# Patient Record
Sex: Male | Born: 1950 | Race: White | Hispanic: No | Marital: Married | Smoking: Former smoker
Health system: Southern US, Community
[De-identification: ages and names within clinical notes are randomized; demographics above are authoritative.]

## PROBLEM LIST (undated history)

## (undated) DIAGNOSIS — C801 Malignant (primary) neoplasm, unspecified: Secondary | ICD-10-CM

## (undated) HISTORY — PX: EYE SURGERY: SHX253

## (undated) HISTORY — DX: Malignant (primary) neoplasm, unspecified: C80.1

## (undated) HISTORY — PX: HERNIA REPAIR: SHX51

## (undated) HISTORY — PX: TONSILLECTOMY: SUR1361

---

## 2000-10-26 ENCOUNTER — Other Ambulatory Visit: Admission: RE | Admit: 2000-10-26 | Discharge: 2000-10-26 | Payer: Self-pay | Admitting: Gastroenterology

## 2000-10-26 ENCOUNTER — Encounter (INDEPENDENT_AMBULATORY_CARE_PROVIDER_SITE_OTHER): Payer: Self-pay | Admitting: Specialist

## 2002-01-17 ENCOUNTER — Encounter: Payer: Self-pay | Admitting: Internal Medicine

## 2002-01-17 ENCOUNTER — Inpatient Hospital Stay (HOSPITAL_COMMUNITY): Admission: EM | Admit: 2002-01-17 | Discharge: 2002-01-19 | Payer: Self-pay | Admitting: Emergency Medicine

## 2003-10-01 ENCOUNTER — Ambulatory Visit (HOSPITAL_COMMUNITY): Admission: RE | Admit: 2003-10-01 | Discharge: 2003-10-01 | Payer: Self-pay

## 2003-10-01 ENCOUNTER — Ambulatory Visit (HOSPITAL_BASED_OUTPATIENT_CLINIC_OR_DEPARTMENT_OTHER): Admission: RE | Admit: 2003-10-01 | Discharge: 2003-10-01 | Payer: Self-pay

## 2004-05-20 ENCOUNTER — Ambulatory Visit (HOSPITAL_COMMUNITY): Admission: RE | Admit: 2004-05-20 | Discharge: 2004-05-20 | Payer: Self-pay | Admitting: Family Medicine

## 2006-08-30 DIAGNOSIS — C801 Malignant (primary) neoplasm, unspecified: Secondary | ICD-10-CM

## 2006-08-30 HISTORY — DX: Malignant (primary) neoplasm, unspecified: C80.1

## 2007-06-21 ENCOUNTER — Other Ambulatory Visit: Admission: RE | Admit: 2007-06-21 | Discharge: 2007-06-21 | Payer: Self-pay | Admitting: Otolaryngology

## 2007-07-18 ENCOUNTER — Ambulatory Visit (HOSPITAL_COMMUNITY): Admission: RE | Admit: 2007-07-18 | Discharge: 2007-07-18 | Payer: Self-pay | Admitting: Otolaryngology

## 2007-07-18 IMAGING — PT NM PET TUM IMG SKULL BASE T - THIGH
6 series · 25 of 25 positions shown · non-contrast
Comparison: none

CLINICAL DATA: 56 year old male with enlargement of the left tonsil and an enlarged lymph node.  FNA equivocal.  
FDG PET-CT TUMOR IMAGING (SKULL BASE TO THIGHS) ? [DATE]: 
Fasting Blood Glucose:  156.
TECHNIQUE: 17.6 mCi F18-FDG were administered via the left forearm.  Full ring PET imaging was performed from the skull base through the mid-thighs 70 minutes after injection.  CT data was obtained and used for attenuation correction and anatomic localization only.  (This was not acquired as a diagnostic CT examination.)  Additional neck images were performed with arms in down position.

[Series 1: pet ac · axial · 3.3mm · 4.69mm/px · z∈[-294,+0]mm · 5 of 91 slices shown]
[im 1/91]
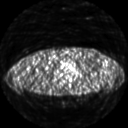
[im 23/91]
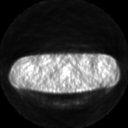
[im 46/91]
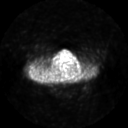
[im 68/91]
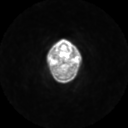
[im 91/91]
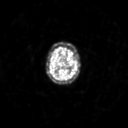

[Series 2: ct neck · axial · 3.8mm · 0.98mm/px · z∈[-294,+0]mm · 5 of 91 slices shown]
[im 1/91]
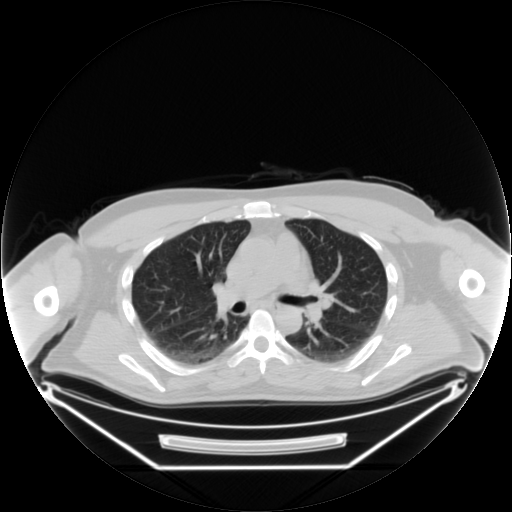
[im 23/91]
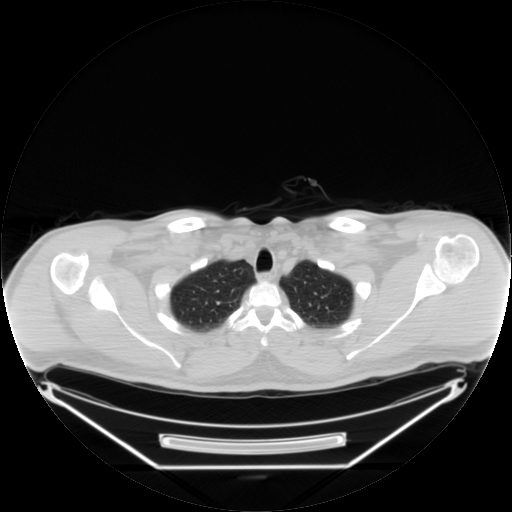
[im 46/91]
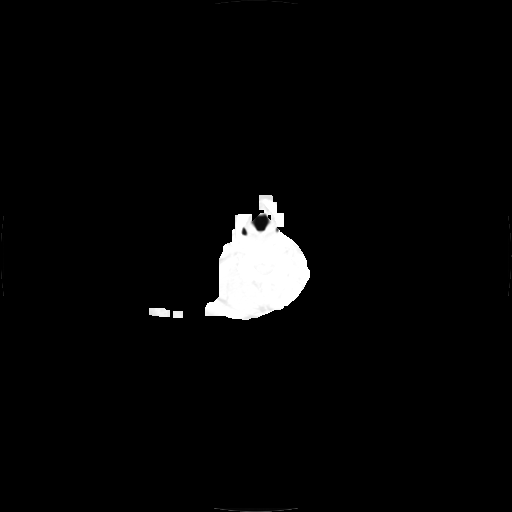
[im 68/91]
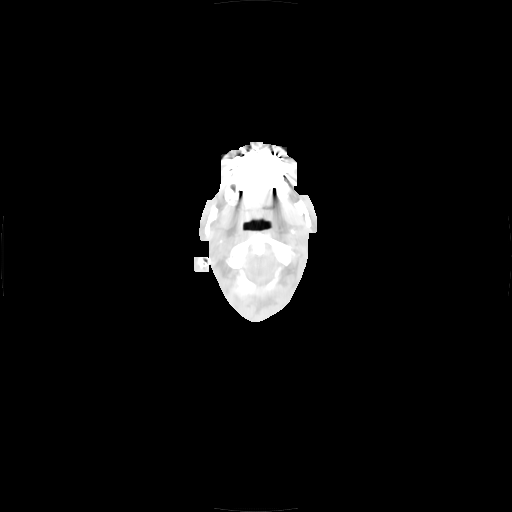
[im 91/91  brain]
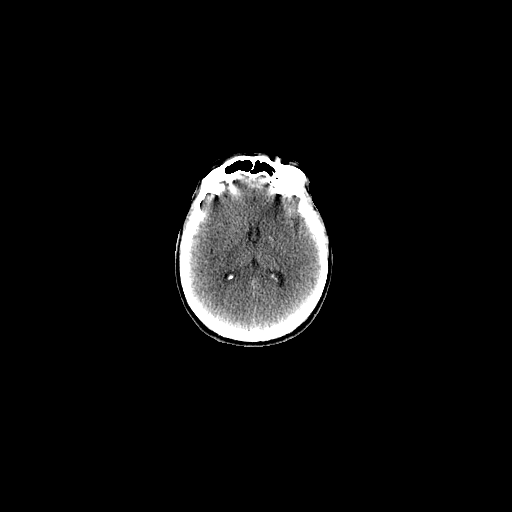

[Series 2: pet nac · axial · 3.3mm · 4.69mm/px · z∈[-294,+0]mm · 5 of 91 slices shown]
[im 1/91]
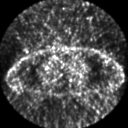
[im 23/91]
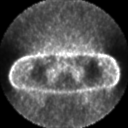
[im 46/91]
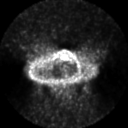
[im 68/91]
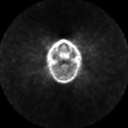
[im 91/91]
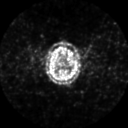

[Series 123: mip · coronal · 3.3mm · 4.69mm/px · 2 of 30 slices shown]
[im 1/30]
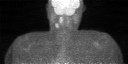
[im 30/30]
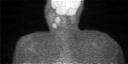

[Series 151: reformatted · axial · 3.3mm · 3.91mm/px · z∈[-294,-3]mm · 5 of 89 slices shown (1 of 2)]
[im 1/89]
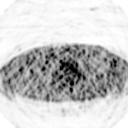
[im 23/89]
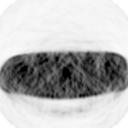
[im 45/89]
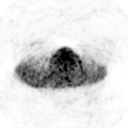
[im 67/89]
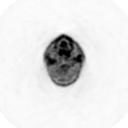
[im 89/89]
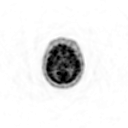

[Series 153: reformatted · coronal · 4.7mm · 4.69mm/px · 3 of 53 slices shown (2 of 2)]
[im 1/53]
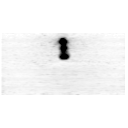
[im 27/53]
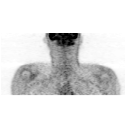
[im 53/53]
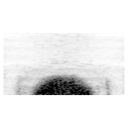

[25 of 25 positions shown; findings below may reference images not displayed]

FINDINGS: Neck:  There is asymmetric hypermetabolic activity in the left hypopharynx which is associated with asymmetric left palatine tonsillar enlargement and SUV-max equal to 7.8 (image #28).  There is an adjacent 16 mm left Level 2-A lymph node (image #36) with an SUV-max equal to 6.1.  There are multiple scattered subcentimeter Level 2-A and 2-B lymph nodes bilaterally which do not have appreciable FDG uptake.  
Thorax:  No hypermetabolic mediastinal or hilar lymph nodes.  No suspicious pulmonary nodules.
Abdomen and Pelvis:  No hypermetabolic abnormal activity within the solid organs.  No hypermetabolic lymph nodes within the abdomen or pelvis.
IMPRESSION: 1.  Intensely hypermetabolic and asymmetric enlargement within the left tonsillar region is concerning for malignancy and much less likely post-biopsy inflammation.  
2.  Intensely hypermetabolic left Level 2-A lymph node (16 mm) concerning for nodal metastasis.  
3.  No evidence of distant metastasis in the chest, abdomen, or pelvis.

## 2007-07-18 IMAGING — PT NM PET TUM IMG SKULL BASE T - THIGH
6 series · 25 of 25 positions shown · non-contrast
Comparison: none

CLINICAL DATA: 56 year old male with enlargement of the left tonsil and an enlarged lymph node.  FNA equivocal.  
FDG PET-CT TUMOR IMAGING (SKULL BASE TO THIGHS) ? [DATE]: 
Fasting Blood Glucose:  156.
TECHNIQUE: 17.6 mCi F18-FDG were administered via the left forearm.  Full ring PET imaging was performed from the skull base through the mid-thighs 70 minutes after injection.  CT data was obtained and used for attenuation correction and anatomic localization only.  (This was not acquired as a diagnostic CT examination.)  Additional neck images were performed with arms in down position.

[Series 1: pet ac · axial · 3.3mm · 4.69mm/px · z∈[-870,+0]mm · 5 of 267 slices shown]
[im 1/267]
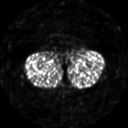
[im 67/267]
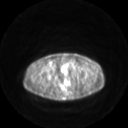
[im 134/267]
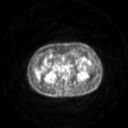
[im 200/267]
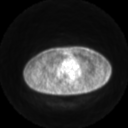
[im 267/267]
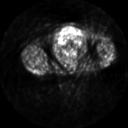

[Series 2: pet nac · axial · 3.3mm · 4.69mm/px · z∈[-870,+0]mm · 6 of 267 slices shown]
[im 1/267]
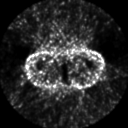
[im 54/267]
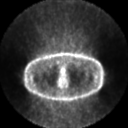
[im 107/267]
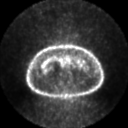
[im 160/267]
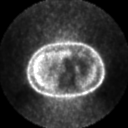
[im 213/267]
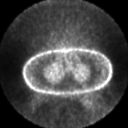
[im 267/267]
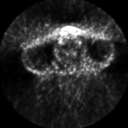

[Series 2: ct images · axial · 3.8mm · 0.98mm/px · z∈[-870,+0]mm · 6 of 267 slices shown]
[im 1/267]
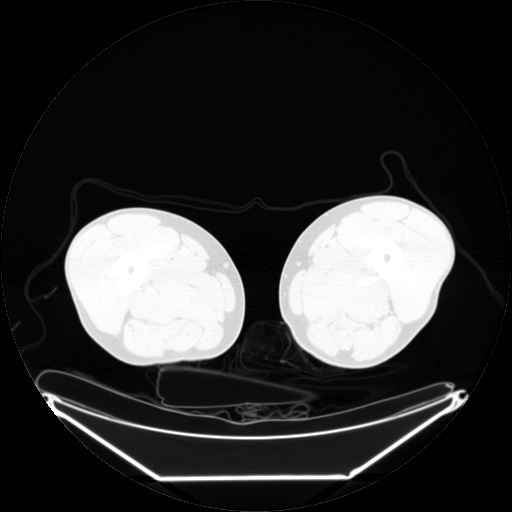
[im 54/267]
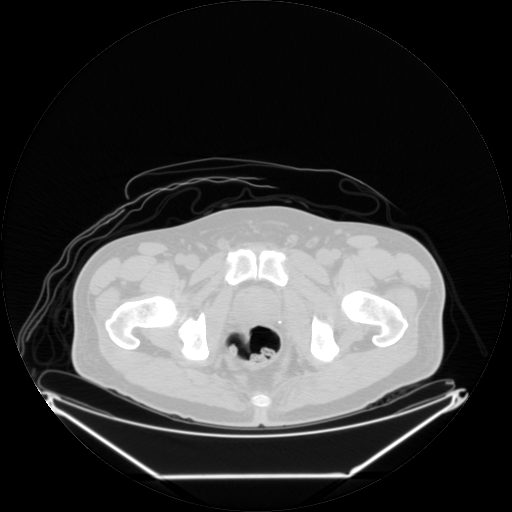
[im 107/267]
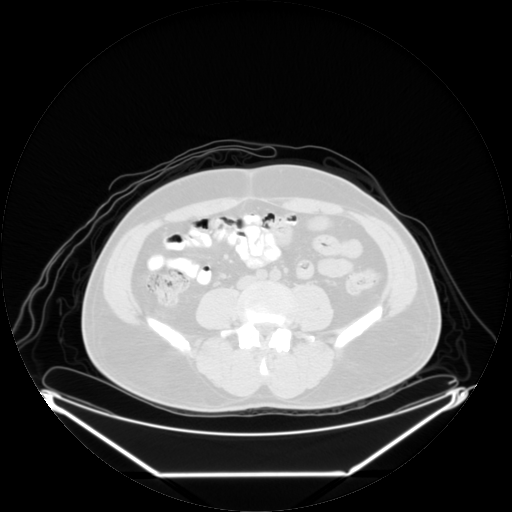
[im 160/267]
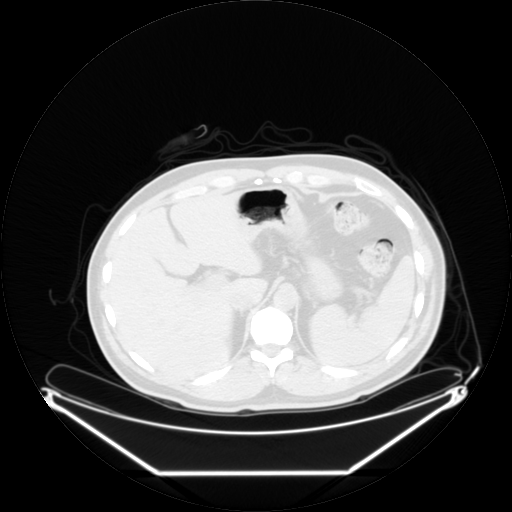
[im 213/267]
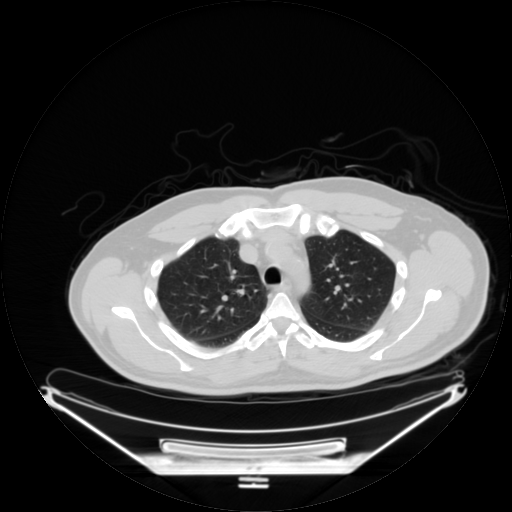
[im 267/267]
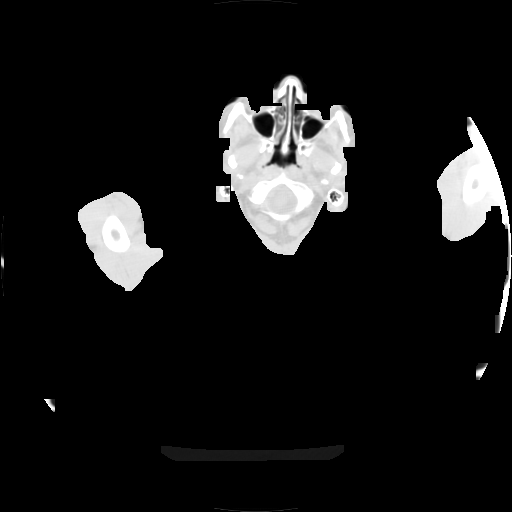

[Series 123: mip · coronal · 3.3mm · 4.69mm/px · 1 of 30 slices shown]
[im 1/30]
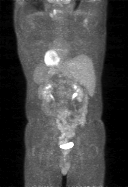

[Series 151: reformatted · axial · 3.3mm · 3.91mm/px · z∈[-863,-7]mm · 6 of 263 slices shown (1 of 2)]
[im 1/263]
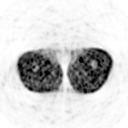
[im 53/263]
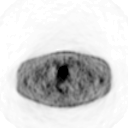
[im 105/263]
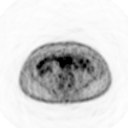
[im 158/263]
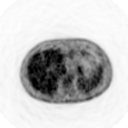
[im 210/263]
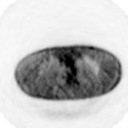
[im 263/263]
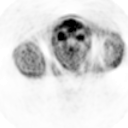

[Series 153: reformatted · coronal · 4.7mm · 6.98mm/px · 1 of 61 slices shown (2 of 2)]
[im 1/61]
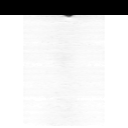

[25 of 25 positions shown; findings below may reference images not displayed]

FINDINGS: Neck:  There is asymmetric hypermetabolic activity in the left hypopharynx which is associated with asymmetric left palatine tonsillar enlargement and SUV-max equal to 7.8 (image #28).  There is an adjacent 16 mm left Level 2-A lymph node (image #36) with an SUV-max equal to 6.1.  There are multiple scattered subcentimeter Level 2-A and 2-B lymph nodes bilaterally which do not have appreciable FDG uptake.  
Thorax:  No hypermetabolic mediastinal or hilar lymph nodes.  No suspicious pulmonary nodules.
Abdomen and Pelvis:  No hypermetabolic abnormal activity within the solid organs.  No hypermetabolic lymph nodes within the abdomen or pelvis.
IMPRESSION: 1.  Intensely hypermetabolic and asymmetric enlargement within the left tonsillar region is concerning for malignancy and much less likely post-biopsy inflammation.  
2.  Intensely hypermetabolic left Level 2-A lymph node (16 mm) concerning for nodal metastasis.  
3.  No evidence of distant metastasis in the chest, abdomen, or pelvis.

## 2008-09-25 ENCOUNTER — Ambulatory Visit (HOSPITAL_COMMUNITY): Admission: RE | Admit: 2008-09-25 | Discharge: 2008-09-25 | Payer: Self-pay | Admitting: Family Medicine

## 2008-09-25 IMAGING — CR DG CHEST 2V
2 series · 2 of 2 positions shown · non-contrast
Comparison: [DATE]

CLINICAL DATA: Cough.

CHEST - 2 VIEW

[view not recorded (1 of 2)]
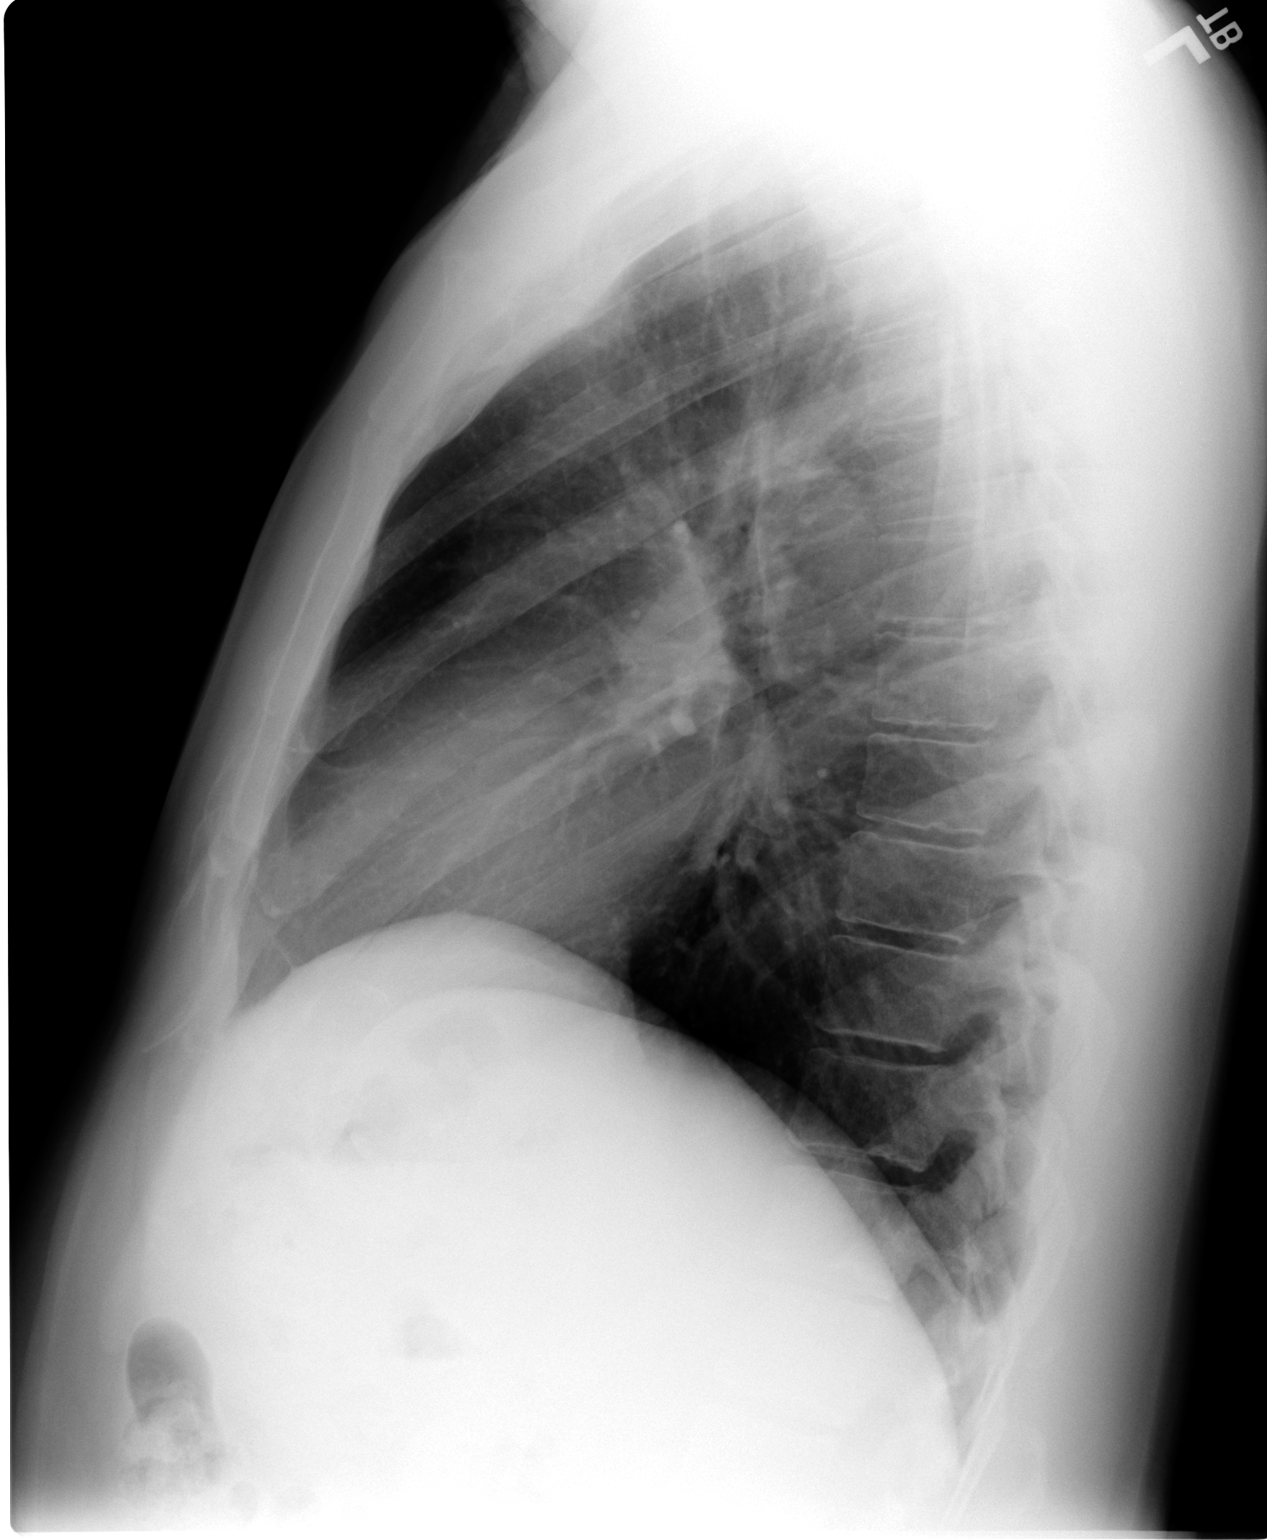

[view not recorded (2 of 2)]
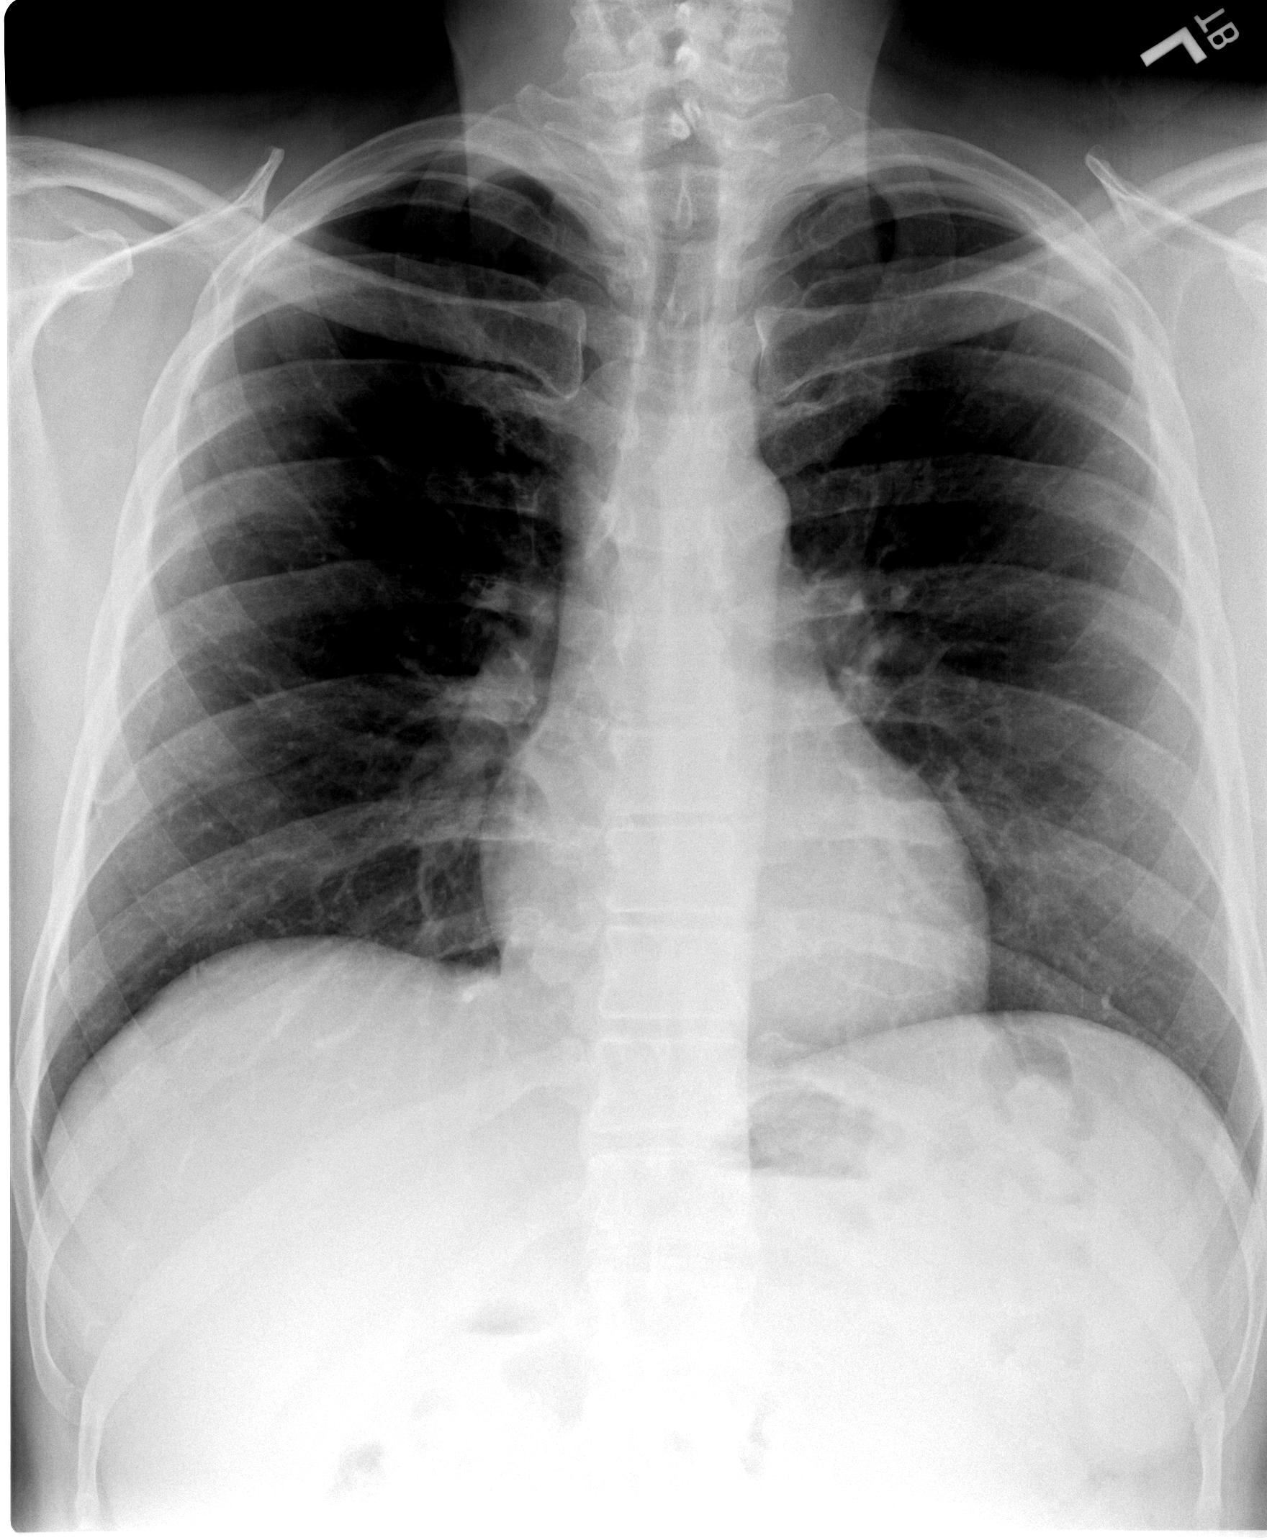

[2 of 2 positions shown; findings below may reference images not displayed]

FINDINGS: The heart size and mediastinal contours are within normal
limits.  Both lungs are clear.  The visualized skeletal structures
are unremarkable.
IMPRESSION: No active cardiopulmonary disease.

## 2008-10-11 ENCOUNTER — Ambulatory Visit (HOSPITAL_COMMUNITY): Admission: RE | Admit: 2008-10-11 | Discharge: 2008-10-11 | Payer: Self-pay | Admitting: Family Medicine

## 2008-10-11 IMAGING — US US ABDOMEN COMPLETE
1 series · 14 of 25 positions shown · non-contrast
Comparison: None

CLINICAL DATA: Abdominal pain, nausea, diabetes

ABDOMEN ULTRASOUND
TECHNIQUE: Complete abdominal ultrasound examination was performed
including evaluation of the liver, gallbladder, bile ducts,
pancreas, kidneys, spleen, IVC, and abdominal aorta.

[Series 1: unknown · 0.40mm/px · 14 of 57 slices shown]
[im 1/57]
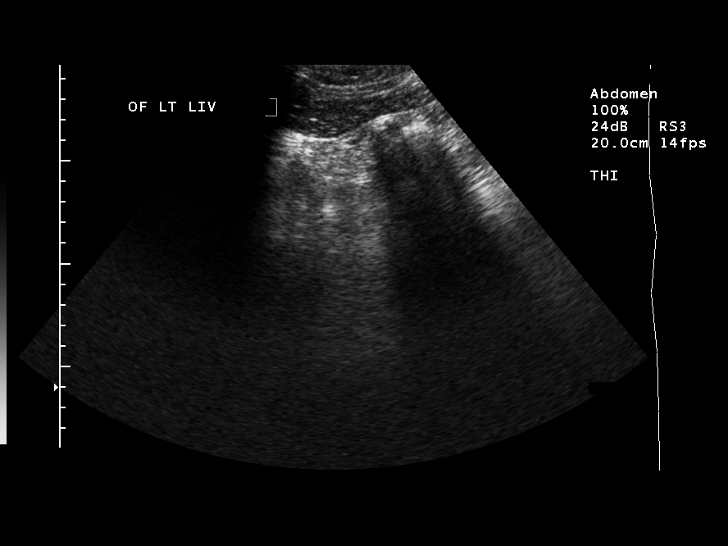
[im 5/57]
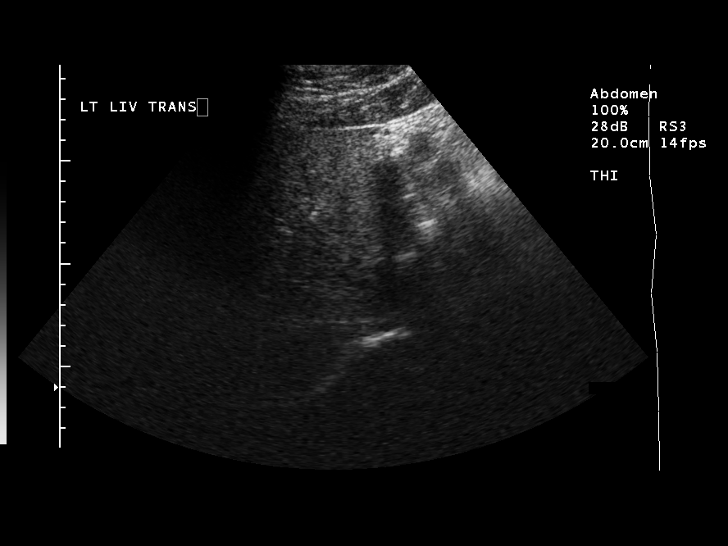
[im 10/57]
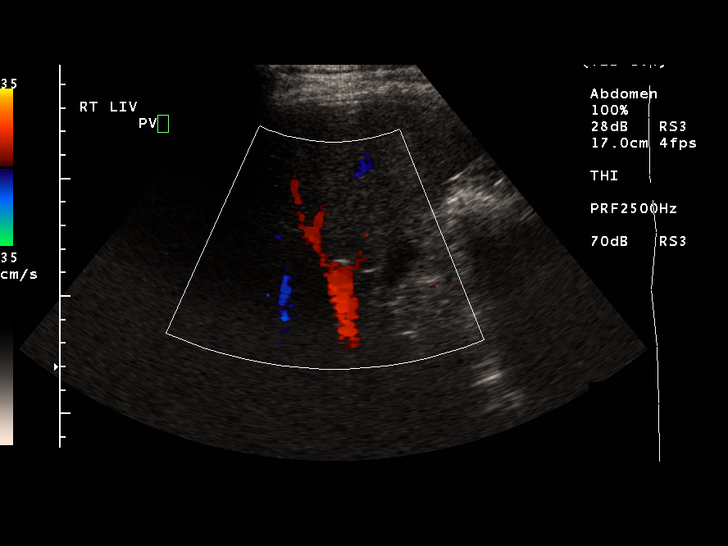
[im 15/57]
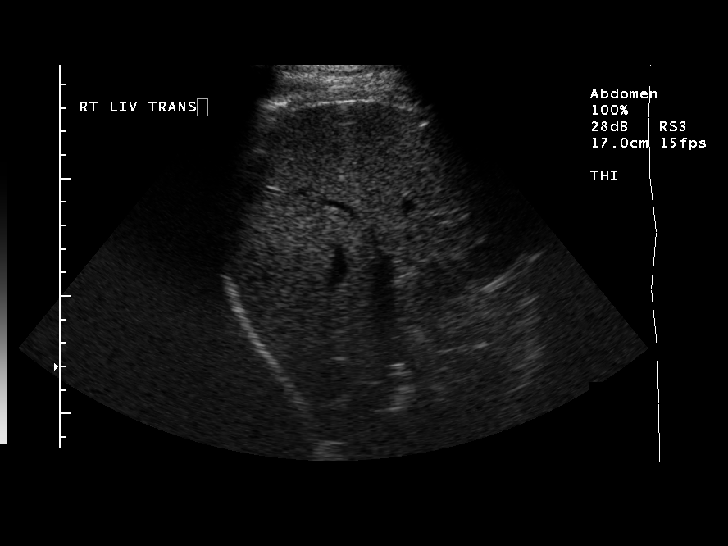
[im 19/57]
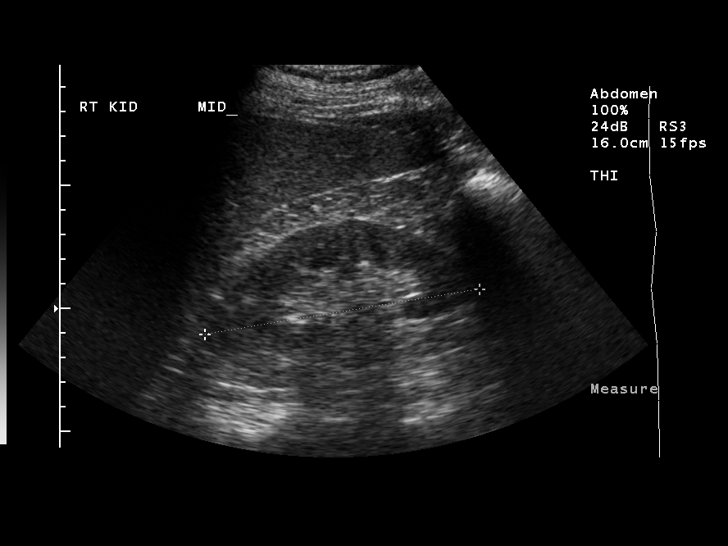
[im 22/57]
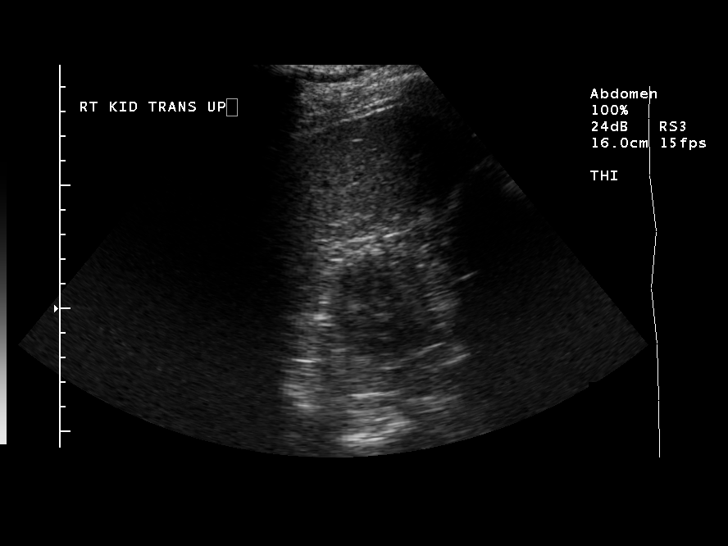
[im 26/57]
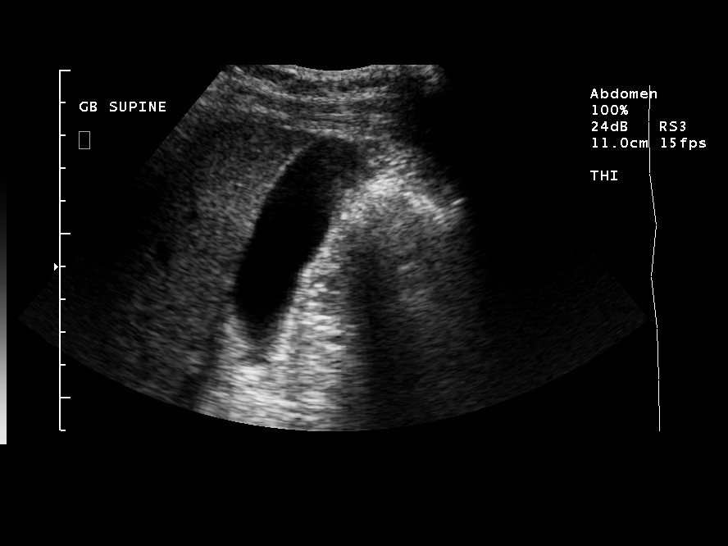
[im 31/57]
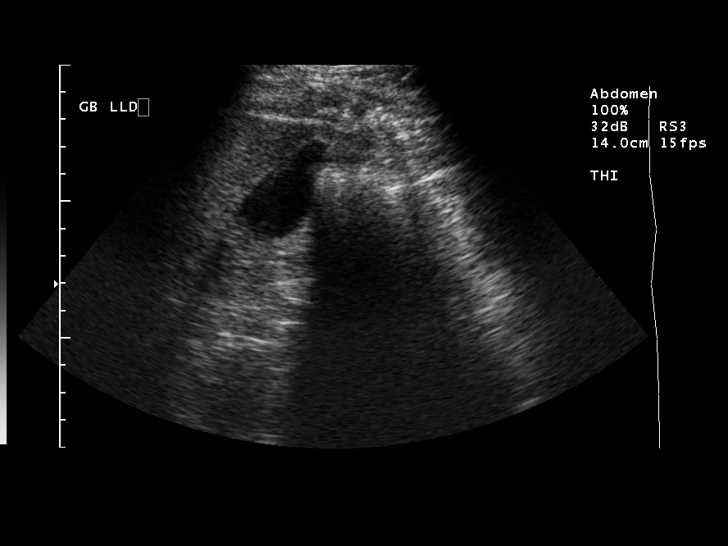
[im 36/57]
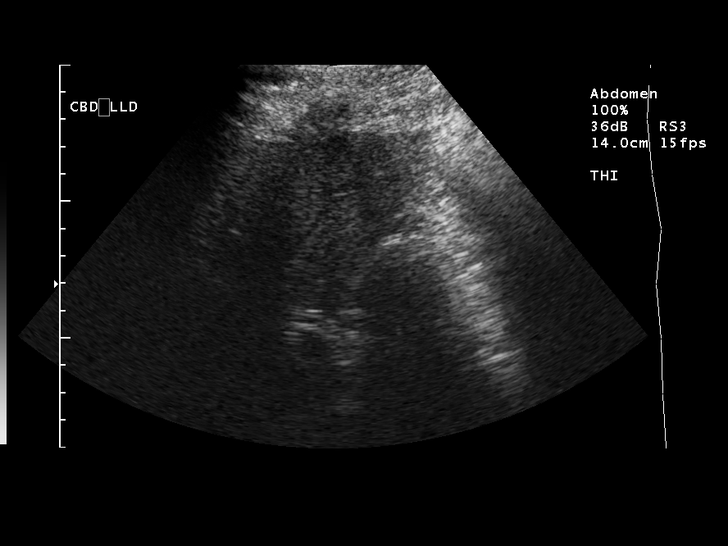
[im 38/57]
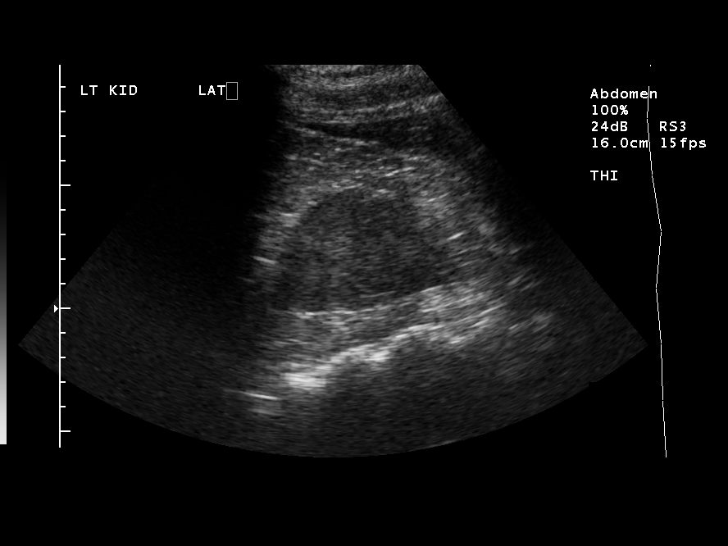
[im 43/57]
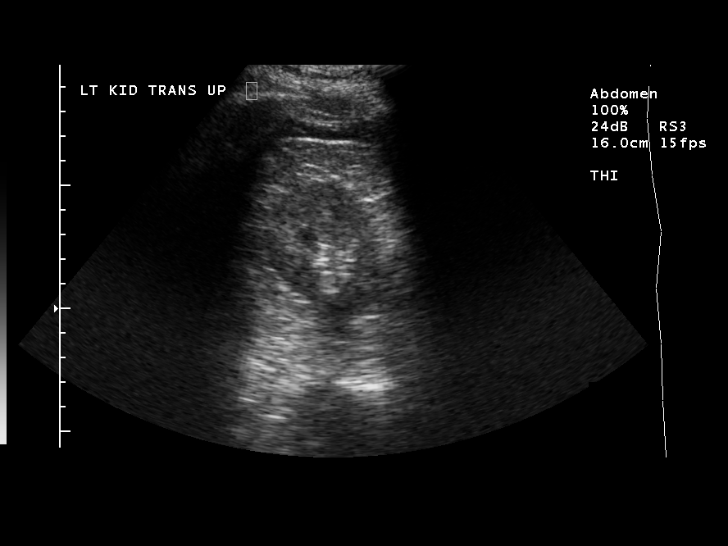
[im 47/57]
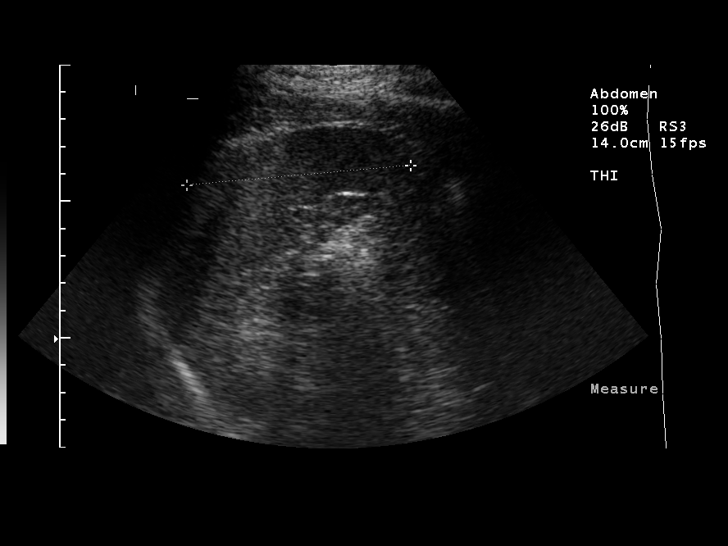
[im 52/57]
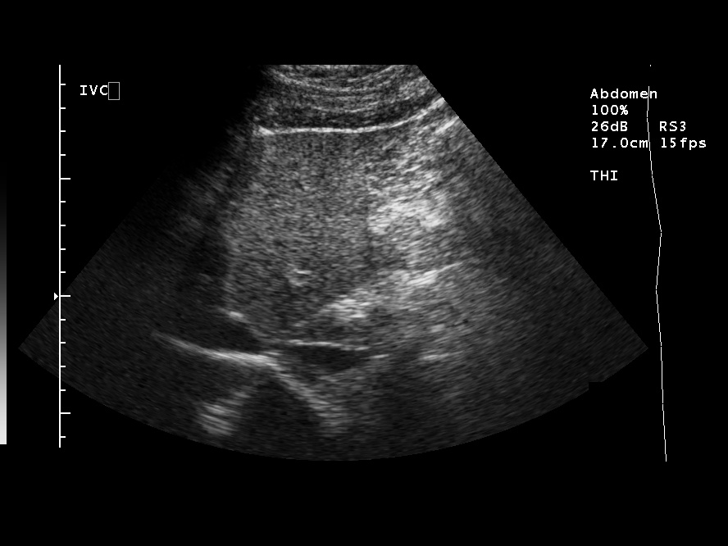
[im 57/57]
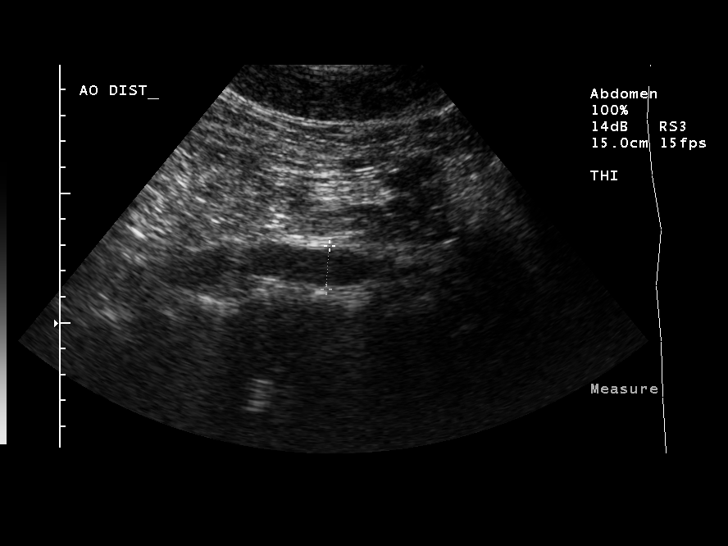

[14 of 25 positions shown; findings below may reference images not displayed]

FINDINGS: Gallbladder normally distended without stones or wall thickening.
Common bile duct 4 mm diameter proximally, normal.
Distal CBD, IVC, pancreas, and proximal aorta are poorly visualized
due to bowel gas.
No definite focal hepatic or splenic abnormalities, spleen 8.2 cm
length.
Kidneys normal size and morphology, 11.3 cm length right and
cm left.
No ascites.
IMPRESSION: Inadequate visualization of the IVC, pancreas, proximal aorta, and
distal CBD due to bowel gas.
Otherwise negative exam.

REF:A1 DICTATED: [DATE] [DATE]

## 2011-01-15 NOTE — Discharge Summary (Signed)
[facility]  Patient:    Samuel Barr, Samuel Barr Visit Number: [id number] MRN: [id number]          Service Type: MED Location: 2A [phone] Attending Physician:  Cassell Smiles. Dictated by:   Elfredia Nevins, M.D. Admit Date:  01/17/2002 Discharge Date: 01/19/2002                             Discharge Summary  DISCHARGE DIAGNOSES: 1. Non-insulin-dependent diabetes mellitus. 2. Hyperlipidemia. 3. Chest pain of uncertain etiology, asymptomatic. 4. Bradycardia.  SUMMARY:  The patient was admitted with unexplained chest pain and flat cardiac enzymes.  A cardiology consultation was obtained while in house.  He was discharged in stable condition by [facility] and transferred to [facility] for continued cardiac evaluation and catheterization. Dictated by:   Elfredia Nevins, M.D. Attending Physician:  Cassell Smiles DD:  02/15/02 TD:  02/16/02 Job: [id number]

## 2011-01-15 NOTE — H&P (Signed)
[facility]  Patient:    Samuel Barr, Samuel Barr Visit Number: [id number] MRN: [id number]          Service Type: MED Location: 2A [id number] 01 Attending Physician:  Hilario Quarry Dictated by:   Elfredia Nevins, M.D. Admit Date:  01/17/2002                           History and Physical  DATE OF BIRTH:  03/09/1951  CHIEF COMPLAINT:  Chest pain.  HISTORY OF PRESENT ILLNESS:  The patient was minimally exerting himself at a pasture and developed acute onset of chest pressure radiating into his interscapular area and associated with subjective sense of shortness of breath without true tachypnea or air hunger.  He denied any cough, sputum, hemoptysis, fevers, rigors, or chills.  He had no allergic rhinitis symptoms or runny eyes, etc.  He did note that the flowers smelled particularly strong that day but, again, no true allergic symptomatology.  He denied any palpitations or syncope, nausea, vomiting, or diarrhea, or any other symptoms cardiopulmonary-wise.  He denied any headache.  He had no tick bites, insect bites, fevers, rigors, or chills, or myalgias.  He persisted in having vague chest discomfort throughout the next 24 hours and presented in the office with that story.  He still complained of chest pain.  PAST MEDICAL HISTORY:  Non-insulin-dependent diabetes mellitus.  SOCIAL HISTORY:  Positive for occasional cigarette.  Usually chews tobacco. No alcohol use or drug use.  FAMILY HISTORY:  Positive for father with coronary artery disease but, otherwise, unremarkable.  REVIEW OF SYSTEMS:  As under HPI, all else is negative.  PHYSICAL EXAMINATION:  SKIN:  Unremarkable.  HEENT AND NECK:  No JVD or adenopathy.  Neck is supple.  CHEST:  Clear.  CARDIAC:  Regular rhythm, without murmur, gallop, or rub.  ABDOMEN:  Soft.  No organomegaly or masses.  EXTREMITIES:  Without clubbing, cyanosis, or edema.  NEUROLOGIC:  Nonfocal.  LABORATORY DATA:   EKG revealed Q-waves in II, III, and aVF and intraventricular conduction delay, incomplete left bundle appearance in the apical leads, but no reciprocal changes or acute ischemia or infarction. Initial set of cardiac enzymes was negative.  A d-dimer was less than 0.5.  Chest x-ray showed no acute disease.  IMPRESSION:  Chest pain, unexplained etiology.  The patient was given nitroglycerin in the emergency department without relief x 3.  A subsequent GI cocktail was given without clear-cut relief.  Consultation was obtained for 20 minutes with Dr. Nanetta Batty, on call for cardiology at [facility], and we mutually agreed that observation, serial enzymes at [facility] was warranted at this time.  Consultation will be sought with Dr. Domingo Sep in the morning.  Expectant observation.Dictated by: Elfredia Nevins, M.D. Attending Physician:  Hilario Quarry DD:  01/17/02 TD:  01/19/02 Job: [id number]

## 2011-01-15 NOTE — Cardiovascular Report (Signed)
[facility]  Patient:    Samuel Barr, Samuel Barr Visit Number: [id number] MRN: [id number]          Service Type: MED Location: [phone] Attending Physician:  Nelta Numbers Dictated by:   Daisey Must, M.D. [facility] Proc. Date: 01/19/02 Admit Date:  01/19/2002 Discharge Date: 01/19/2002   CC:         Jonell Cluck, M.D.  Dietrich Pates, M.D. [facility]  Cardiac Catheterization Laboratory   Cardiac Catheterization  PROCEDURES PERFORMED: Left heart catheterization with coronary angiography and left ventriculography.  INDICATIONS: The patient is a 60 year old male with diabetes mellitus. He was admitted to [facility] with chest pain and transient ECG changes. He was referred for cardiac catheterization.  DESCRIPTION OF PROCEDURE: A 6 French sheath was placed in the right femoral artery.  Standard Judkins 6 French catheters were utilized.  Contrast was Omnipaque.  There were no complications.  RESULTS:  HEMODYNAMICS: Left ventricular pressure 125/15. Aortic pressure 112/66.  There was no aortic valve gradient.  LEFT VENTRICULOGRAM: Wall motion is normal. Ejection fraction calculated at 63%. There is no mitral regurgitation.  CORONARY ARTERIOGRAPHY: (Right dominant).  Left main has an ostial 20% stenosis.  Left anterior descending artery is a relatively small vessel. It gives rise to a small first diagonal and a normal sized second diagonal branch. The LAD is normal.  Left circumflex is a small vessel. It gives rise to a small OM-1 and a small OM-2. The left circumflex is normal.  Right coronary artery is a very large super dominant vessel. There is a 20% stenosis in the ostium of the right coronary artery. The right coronary artery is otherwise angiographically normal. The distal right coronary artery gives rise to a normal sized posterior descending artery, small first and second posterolateral branches, large third  posterolateral branch, small forth posterolateral branch, and a normal sized fifth posterolateral branch.  IMPRESSIONS: 1. Normal left ventricular systolic function. 2. No significant coronary artery disease identified. Dictated by:   Daisey Must, M.D. [facility] Attending Physician:  Nelta Numbers DD:  01/19/02 TD:  01/22/02 Job: [id number]

## 2011-01-15 NOTE — Op Note (Signed)
NAME:  Samuel Barr, Samuel Barr                         ACCOUNT NO.:  [id number]   MEDICAL RECORD NO.:  [id number]                   PATIENT TYPE:  AMB   LOCATION:  [facility]                                 FACILITY:  [facility]   PHYSICIAN:  Lorre Munroe., M.D.            DATE OF BIRTH:  July 19, 1951   DATE OF PROCEDURE:  10/01/2003  DATE OF DISCHARGE:                                 OPERATIVE REPORT   PREOPERATIVE DIAGNOSIS:  Right inguinal hernia.   POSTOPERATIVE DIAGNOSIS:  Right inguinal hernia.   OPERATION/PROCEDURE:  Repair of right inguinal hernia.   SURGEON:  Lebron Conners, M.D.   ANESTHESIA:  General and local.   DESCRIPTION OF PROCEDURE:  After the patient was monitored and anesthetized  and had routine preparation and draping of the right inguinal region, I made  an oblique incision beginning just at the pubic tubercle and going outward  about 6 cm in length.  I then deepened the dissection through the  subcutaneous fat until I came to the external oblique and I opened it in the  direction of its fibers, taking care to avoid injuring the ilioinguinal  nerve.  There was a hernia evident, bulging just medial to the deep ring.  I  separated the spermatic cord from the underlying tissues and encircled it  with a Penrose drain.  The most medial part of the inguinal floor was sound  but the hernia appeared to be coming through the floor, just in the inferior  and medial floor next to the spermatic cord, but lateral to the inferior  epigastric artery.  I separated the hernia from the spermatic cord and  reduced it through the deep ring, and held that in with a plug or  polypropylene mesh held in place with a single suture of 2-0 silk.  I then  fashioned a patch of polypropylene mesh to fit the inguinal floor and made a  slit in it to allow the spermatic cord to exit the abdominal wall into the  scrotum and I sewed that in __________the pubic tubercle and sewing medially  and superiorly with  basting, running 2-0 Prolene and laterally and  inferiorly with a simple running 2-0 Prolene placed in the shelving edge of  the inguinal ligament.  I used a single 2-0 Prolene suture to join the tails  of the mesh together just lateral to the deep ring and I felt that the  hernia was well repaired.  Hemostasis was excellent.  I then thoroughly  anesthetized the operative field with a long-acting local anesthetic.  The  sponge, needle and instrument counts were correct.  I closed the external  oblique aponeurosis with running 3-0 Vicryl and closed the subcutaneous  tissues with separate running 3-0 Vicryl, then closed the skin with  intracuticular 4-0 Vicryl and Steri-Strips.  After application of a bandage,  the patient went to PACU in stable condition.  Lorre Munroe., M.D.   Samuel Barr  D:  10/01/2003  T:  10/01/2003  Job:  [id number]   cc:   Patrica Duel, M.D.  [address]  [place]  [place] [address]  Fax: [phone]

## 2011-01-15 NOTE — Discharge Summary (Signed)
[place]  Patient:    Samuel Barr, Samuel Barr Visit Number: [id number] MRN: [id number]          Service Type: MED Location: [phone] [id number] Attending Physician:  Nelta Numbers Dictated by:   Joellyn Rued, P.A.-C. Admit Date:  01/19/2002 Disc. Date: 01/19/02                    Referring Physician Discharge [facility]  DATE OF BIRTH:  1951-06-13  ADMITTING PHYSICIAN:  Gerrit Friends. Dietrich Pates, M.D.  SUMMARY OF HISTORY:   Samuel Barr is a 60 year old white male who was admitted to [facility] on Jan 17, 2002 by his primary care physician complaining of chest tightness; we saw in consultation on May 22.  He describes this as a tightness than began on Tuesday while cutting limbs with a chainsaw.  He attributed it to allergies because of all the pollen and the various fragrances.  He has never had similar discomfort.  He took a Benadryl that night.  However, the following day he had reoccurring symptoms associated with mild shortness of breath.  He does have a history of non-insulin-dependent diabetes.  He quit smoking tobacco in 1982; prior to that he smoked one pack per day for 15 years.  However, at this time he still chews tobacco.  He does have an early family history.  His prior medications include Amaryl on a varying basis.  LABORATORY DATA:  At [facility], CKs and troponins were negative for myocardial infarction.  Sodium 140, potassium 3.8, BUN 11, creatinine 1.1, glucose 124, normal LFTs.  H&H 15.1 and 43.5, normal indices, platelets 267, wbcs 5.7.  D-dimer was less than 0.5.  EKG showed sinus bradycardia, left axis deviation, left anterior hemiblock.  HOSPITAL COURSE:  Samuel Barr was transferred to [facility] for cardiac catheterization.  This was performed on May 23 by Dr. Gerri Spore.  According to his progress note, he had a less than 20% lesion in the left main, less than 20% ostial RCA.  It was noted that his  coronary arteries were small; however, the RCA was large and dominant.  His ejection fraction was 63% without MR. Dr. Gerri Spore felt that his symptoms were not related to cardiac issues.  Post sheath removal and bedrest he was ambulating without difficulty.  His catheterization site was intact.  Thus, he was discharged home.  DISCHARGE DIAGNOSES: 1. Noncardiac chest discomfort. 2. Non-insulin-dependent diabetes. 3. Tobacco use. 4. Early family history.  DISPOSITION:  He is discharged home, encouraged to quit chewing tobacco, and to continue risk factor modification.  ACTIVITY:  He was advised no lifting, driving, sexual activity, or heavy exertion for two days.  DIET:  Maintain low salt/fat/cholesterol ADA diet.  FOLLOW-UP:  He will have his catheterization site checked in the [facility] office next week and he was asked to arrange a follow-up appointment with Dr. Nobie Putnam in regards to his sugars.  MEDICATIONS:  He was asked to continue his Amaryl as he has been taking it before.  When he sees Dr. Nobie Putnam he was asked to bring all his sugars and his medications to the office. Dictated by:   Joellyn Rued, P.A.-C. Attending Physician:  Nelta Numbers DD:  01/19/02 TD:  01/19/02 Job: [id number]

## 2013-05-15 ENCOUNTER — Other Ambulatory Visit (HOSPITAL_COMMUNITY): Payer: Self-pay | Admitting: Internal Medicine

## 2013-05-15 DIAGNOSIS — R51 Headache: Secondary | ICD-10-CM

## 2013-05-16 ENCOUNTER — Other Ambulatory Visit (HOSPITAL_COMMUNITY): Payer: Self-pay

## 2013-05-21 ENCOUNTER — Ambulatory Visit (HOSPITAL_COMMUNITY)
Admission: RE | Admit: 2013-05-21 | Discharge: 2013-05-21 | Disposition: A | Discharge status: Home / Self Care | Payer: BC Managed Care – PPO | Source: Ambulatory Visit | Attending: Internal Medicine | Admitting: Internal Medicine

## 2013-05-21 DIAGNOSIS — R51 Headache: Secondary | ICD-10-CM | POA: Insufficient documentation

## 2013-05-21 IMAGING — CT CT HEAD W/O CM
1 series · 16 of 30 positions shown, 20 images · non-contrast
Comparison: None.

CLINICAL DATA: Headaches

EXAM:
CT HEAD WITHOUT CONTRAST
TECHNIQUE: Contiguous axial images were obtained from the base of the skull
through the vertex without intravenous contrast.

[Series 2: headseq 4.8 h37s · axial · 0.49mm/px · z∈[+116,+251]mm · 16 of 30 slices shown, 20 images]
[im 2/30  brain]
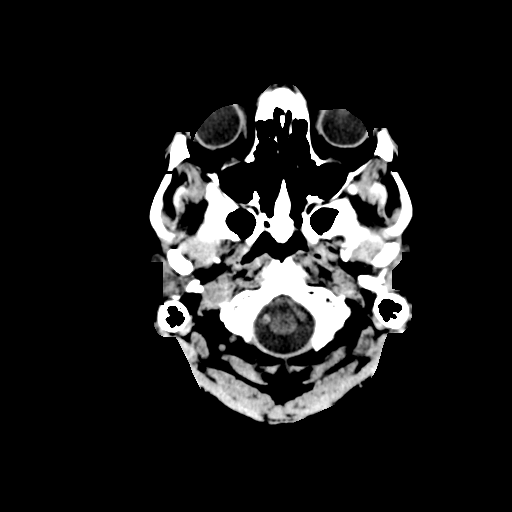
[im 2/30  bone]
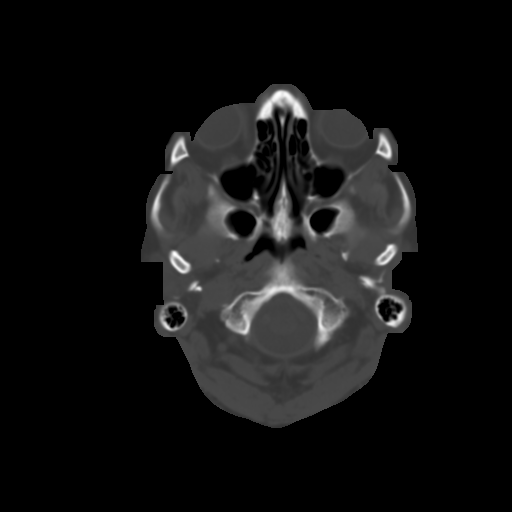
[im 4/30  brain]
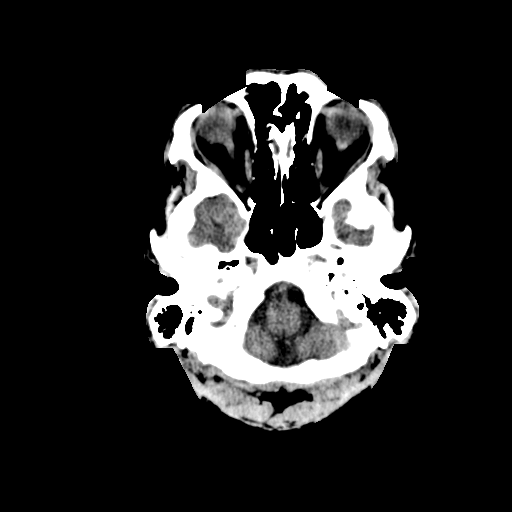
[im 6/30  brain]
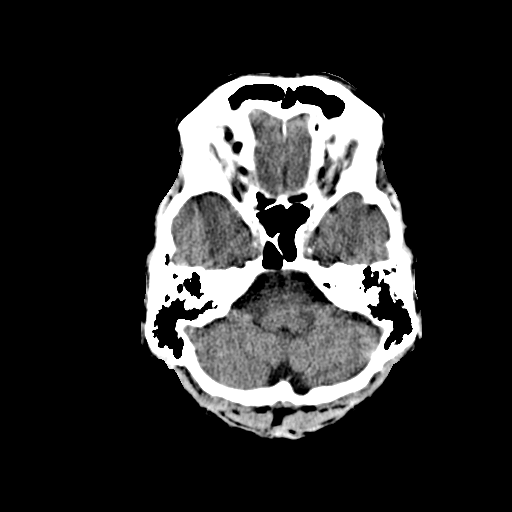
[im 8/30  brain]
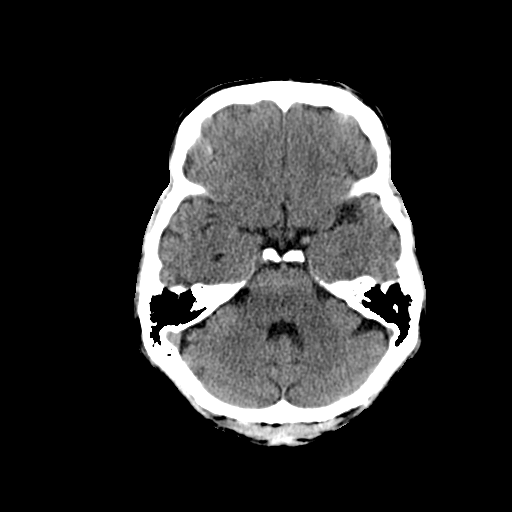
[im 9/30  brain]
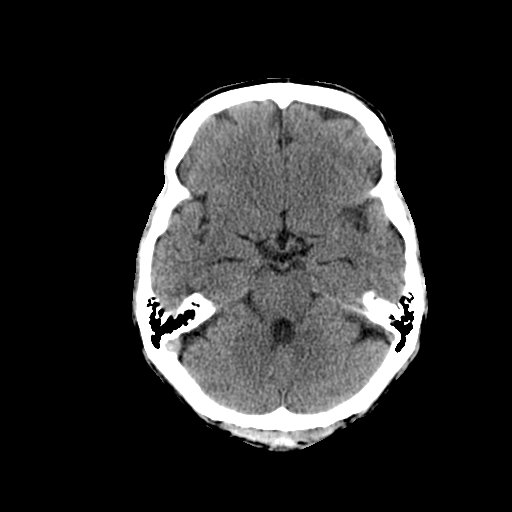
[im 9/30  bone]
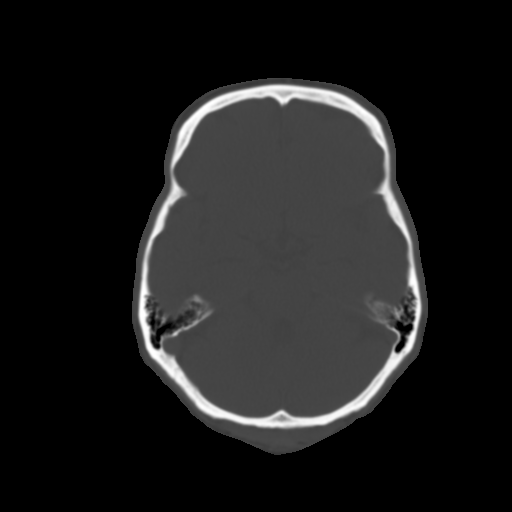
[im 11/30  brain]
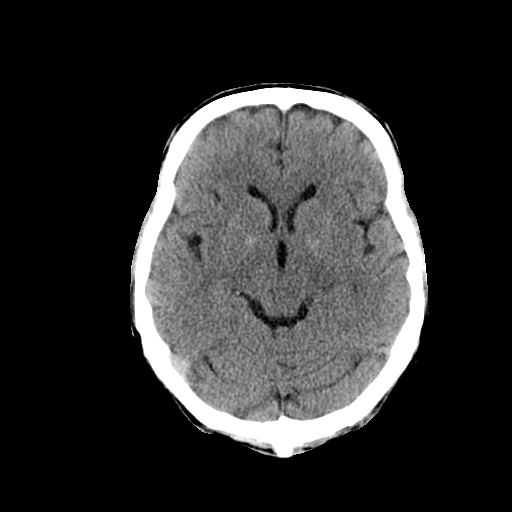
[im 13/30  brain]
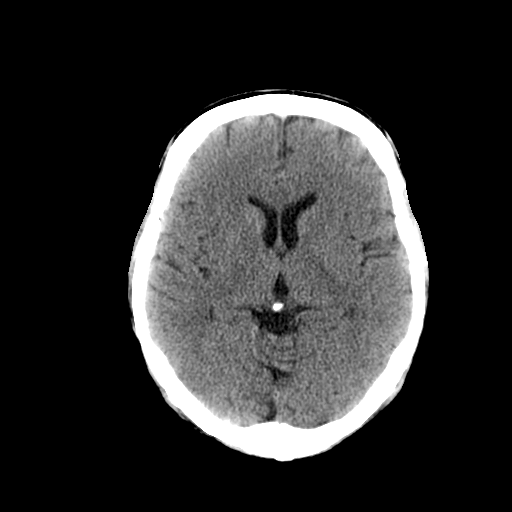
[im 15/30  brain]
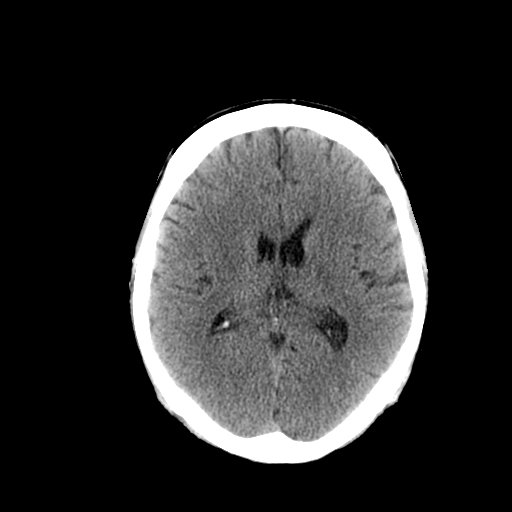
[im 16/30  brain]
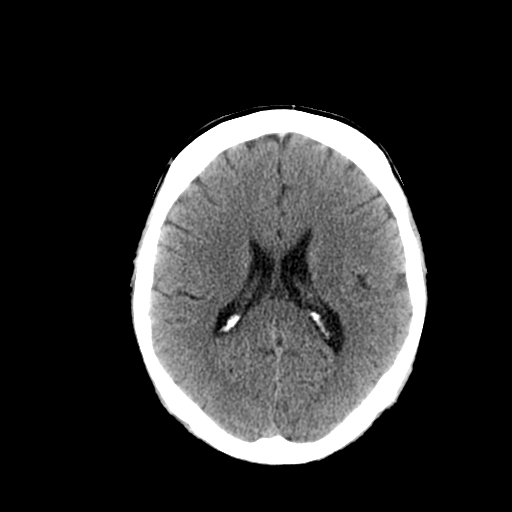
[im 16/30  bone]
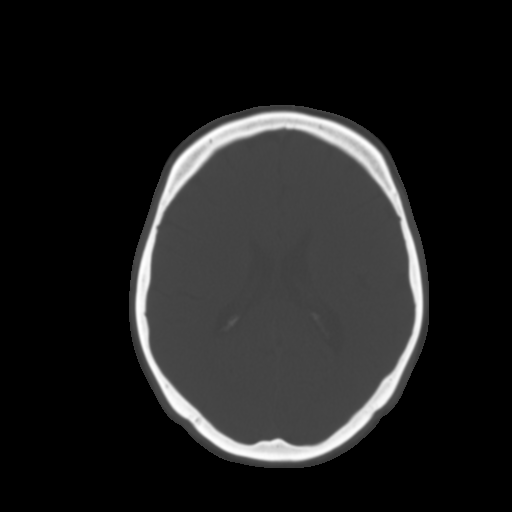
[im 18/30  brain]
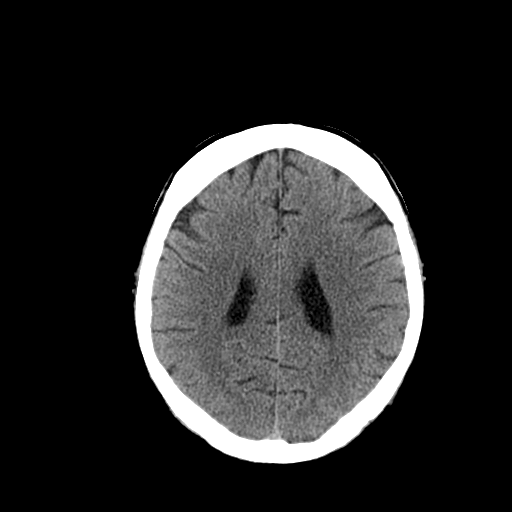
[im 20/30  brain]
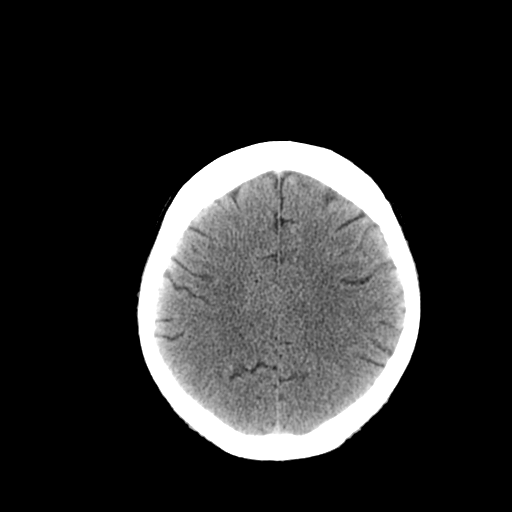
[im 22/30  brain]
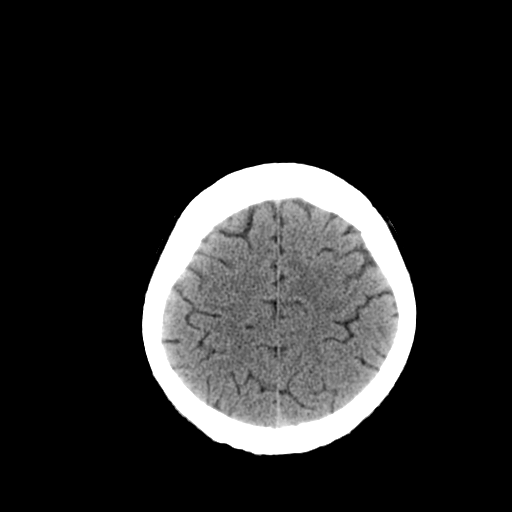
[im 23/30  brain]
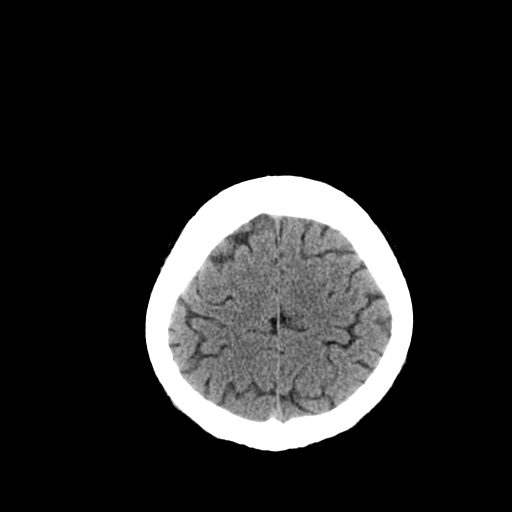
[im 23/30  bone]
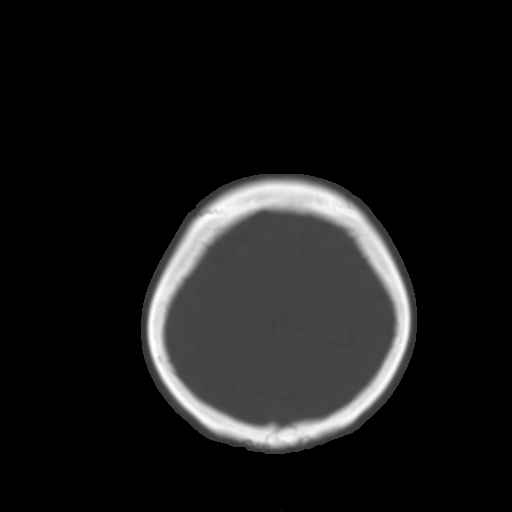
[im 25/30  brain]
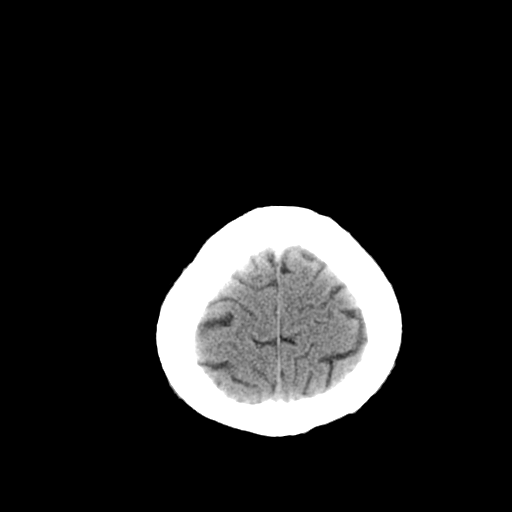
[im 27/30  brain]
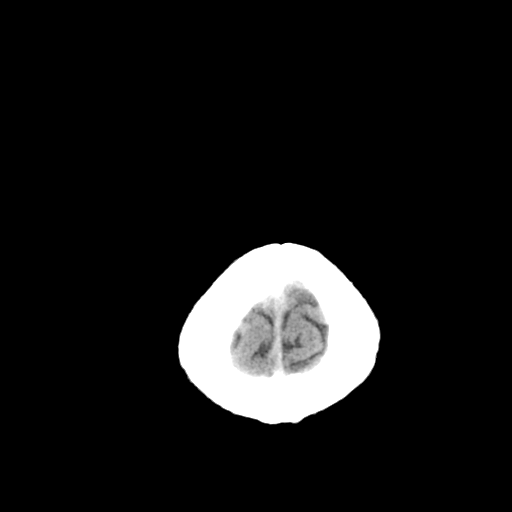
[im 29/30  brain]
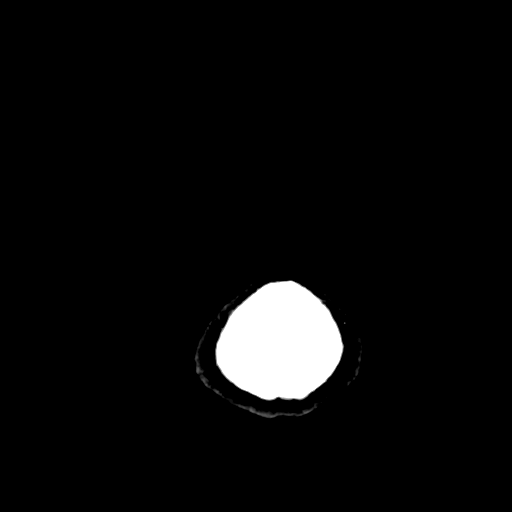

[16 of 30 positions shown; findings below may reference images not displayed]

FINDINGS: There is no midline shift, hydrocephalus, or mass. No acute
hemorrhage or acute transcortical infarct is identified. The bony
calvarium is intact. The visualized sinuses are clear.
IMPRESSION: No focal acute intracranial abnormality identified.

## 2018-06-07 ENCOUNTER — Ambulatory Visit (INDEPENDENT_AMBULATORY_CARE_PROVIDER_SITE_OTHER): Payer: Medicare HMO | Admitting: Cardiology

## 2018-06-07 ENCOUNTER — Encounter: Payer: Self-pay | Admitting: Cardiology

## 2018-06-07 VITALS — BP 142/80 | HR 75 | Ht 70.0 in | Wt 184.4 lb

## 2018-06-07 DIAGNOSIS — Z136 Encounter for screening for cardiovascular disorders: Secondary | ICD-10-CM | POA: Diagnosis not present

## 2018-06-07 DIAGNOSIS — Z23 Encounter for immunization: Secondary | ICD-10-CM | POA: Diagnosis not present

## 2018-06-07 NOTE — Patient Instructions (Signed)
Medication Instructions:   Your physician recommends that you continue on your current medications as directed. Please refer to the Current Medication list given to you today.  Labwork:  none  Testing/Procedures:  none  Follow-Up:  Your physician recommends that you schedule a follow-up appointment in: as needed.  Any Other Special Instructions Will Be Listed Below (If Applicable).  If you need a refill on your cardiac medications before your next appointment, please call your pharmacy. 

## 2018-06-07 NOTE — Progress Notes (Signed)
Clinical Summary Samuel Barr is a 67 y.o.male seen as new consult, referred by Dr Gerarda Fraction for CAD risk assessment.   1. CAD risk assessment - 67 yo male with CAD risk factors including DM2 - no chest pain. Highest activity is yardwork including mowing grass, weedeating, walking dogs. Remains very active and tolerates without any symptoms.  - -2003 cath no significant CAD.    2. DM2 - followed by pcp.  - had been on lisinopril, had cough and stopped. Unclear if ARB was tried.    3. 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 Followed at [facility]    Past Medical History:  Diagnosis Date  . Cancer [facility]) 2008     No Known Allergies   Current Outpatient Medications  Medication Sig Dispense Refill  . aspirin 81 MG chewable tablet Chew 81 mg by mouth daily.    Marland Kitchen levothyroxine (SYNTHROID, LEVOTHROID) 25 MCG tablet Take 25 mcg by mouth daily before breakfast.    . metFORMIN (GLUCOPHAGE) 500 MG tablet Take 500 mg by mouth 2 (two) times daily with a meal.    . Omega-3 Fatty Acids (FISH OIL) 500 MG CAPS Take 350 mg by mouth daily.     No current facility-administered medications for this visit.      History reviewed. No pertinent surgical history.   No Known Allergies    Family History  Problem Relation Age of Onset  . Diabetes Mother   . Heart attack Father      Social History Samuel Barr reports that he quit smoking about 38 years ago. He has never used smokeless tobacco. Samuel Barr reports that he drank alcohol.   Review of Systems CONSTITUTIONAL: No weight loss, fever, chills, weakness or fatigue.  HEENT: Eyes: No visual loss, blurred vision, double vision or yellow sclerae.No hearing loss, sneezing, congestion, runny nose or sore throat.  SKIN: No rash or itching.  CARDIOVASCULAR: per hpi RESPIRATORY: No shortness of breath, cough or sputum.  GASTROINTESTINAL: No anorexia, nausea, vomiting or diarrhea. No abdominal pain or blood.  GENITOURINARY: No  burning on urination, no polyuria NEUROLOGICAL: No headache, dizziness, syncope, paralysis, ataxia, numbness or tingling in the extremities. No change in bowel or bladder control.  MUSCULOSKELETAL: No muscle, back pain, joint pain or stiffness.  LYMPHATICS: No enlarged nodes. No history of splenectomy.  PSYCHIATRIC: No history of depression or anxiety.  ENDOCRINOLOGIC: No reports of sweating, cold or heat intolerance. No polyuria or polydipsia.  Marland Kitchen   Physical Examination Vitals:   06/07/18 1346  BP: (!) 142/80  Pulse: 75  SpO2: 95%   Filed Weights   06/07/18 1346  Weight: 184 lb 6.4 oz (83.6 kg)    Gen: resting comfortably, no acute distress HEENT: no scleral icterus, pupils equal round and reactive, no palptable cervical adenopathy,  CV: RRR, no m/r/g, no jvd Resp: Clear to auscultation bilaterally GI: abdomen is soft, non-tender, non-distended, normal bowel sounds, no hepatosplenomegaly MSK: extremities are warm, no edema.  Skin: warm, no rash Neuro:  no focal deficits Psych: appropriate affect   Diagnostic Studies  2003 cath RESULTS:  HEMODYNAMICS: Left ventricular pressure 125/15. Aortic pressure 112/66.  There was no aortic valve gradient.  LEFT VENTRICULOGRAM: Wall motion is normal. Ejection fraction calculated at 63%. There is no mitral regurgitation.  CORONARY ARTERIOGRAPHY: (Right dominant).  Left main has an ostial 20% stenosis.  Left anterior descending artery is a relatively small vessel. It gives rise to a small first diagonal and  a normal sized second diagonal Sylvana Bonk. The LAD is normal.  Left circumflex is a small vessel. It gives rise to a small OM-1 and a small OM-2. The left circumflex is normal.  Right coronary artery is a very large super dominant vessel. There is a 20% stenosis in the ostium of the right coronary artery. The right coronary artery is otherwise angiographically normal. The distal right coronary artery gives rise to a  normal sized posterior descending artery, small first and second posterolateral branches, large third posterolateral Kennadie Brenner, small forth posterolateral Nijel Flink, and a normal sized fifth posterolateral Amiaya Mcneeley.  IMPRESSIONS: 1. Normal left ventricular systolic function. 2. No significant coronary artery disease identified.     Assessment and Plan   1. CAD risk assessment/Screening for cardiovascular disease - labs not available at this time, cannot calculate his ASCVD risk - he has no recent cardiopulmonary symptoms, there is no indication for screening ischemic testing - focus is risk factor modification. - DM2 management per his pcp. Given he is diabetic would recommend at least moderate strength statin (atorvastatin 40mg  daily) consistent with 2018 ACC/AHA guidelines regardless of his lipid profile - would recommend losartan 12.5mg  daily in setting of DM2 and elevated bp, had cough on ACEI in the past - ASA for primary prevention is no longer recommended based on 2019 ACC/AHA primary prevention guidelines. Discussed stopping his ASA   Patient wishes to read about and discuss above recommendations with his pcp, will notmake any changes today   EKG today shows SR, no ischemic changes.     F/u as needed     Arnoldo Lenis, M.D.

## 2018-12-25 DIAGNOSIS — L84 Corns and callosities: Secondary | ICD-10-CM | POA: Diagnosis not present

## 2018-12-25 DIAGNOSIS — Z1389 Encounter for screening for other disorder: Secondary | ICD-10-CM | POA: Diagnosis not present

## 2018-12-25 DIAGNOSIS — Z6825 Body mass index (BMI) 25.0-25.9, adult: Secondary | ICD-10-CM | POA: Diagnosis not present

## 2018-12-25 DIAGNOSIS — E1142 Type 2 diabetes mellitus with diabetic polyneuropathy: Secondary | ICD-10-CM | POA: Diagnosis not present

## 2018-12-25 DIAGNOSIS — E063 Autoimmune thyroiditis: Secondary | ICD-10-CM | POA: Diagnosis not present

## 2019-03-23 DIAGNOSIS — E119 Type 2 diabetes mellitus without complications: Secondary | ICD-10-CM | POA: Diagnosis not present

## 2019-03-23 DIAGNOSIS — E063 Autoimmune thyroiditis: Secondary | ICD-10-CM | POA: Diagnosis not present

## 2019-03-23 DIAGNOSIS — Z1389 Encounter for screening for other disorder: Secondary | ICD-10-CM | POA: Diagnosis not present

## 2019-03-23 DIAGNOSIS — E1165 Type 2 diabetes mellitus with hyperglycemia: Secondary | ICD-10-CM | POA: Diagnosis not present

## 2019-03-23 DIAGNOSIS — E663 Overweight: Secondary | ICD-10-CM | POA: Diagnosis not present

## 2019-03-23 DIAGNOSIS — Z6825 Body mass index (BMI) 25.0-25.9, adult: Secondary | ICD-10-CM | POA: Diagnosis not present

## 2019-03-23 DIAGNOSIS — Z0000 Encounter for general adult medical examination without abnormal findings: Secondary | ICD-10-CM | POA: Diagnosis not present

## 2019-03-23 DIAGNOSIS — Z125 Encounter for screening for malignant neoplasm of prostate: Secondary | ICD-10-CM | POA: Diagnosis not present

## 2019-03-23 DIAGNOSIS — Z0001 Encounter for general adult medical examination with abnormal findings: Secondary | ICD-10-CM | POA: Diagnosis not present

## 2019-06-29 ENCOUNTER — Other Ambulatory Visit: Payer: Self-pay

## 2019-06-29 DIAGNOSIS — Z20822 Contact with and (suspected) exposure to covid-19: Secondary | ICD-10-CM

## 2019-06-29 DIAGNOSIS — Z20828 Contact with and (suspected) exposure to other viral communicable diseases: Secondary | ICD-10-CM | POA: Diagnosis not present

## 2019-06-30 DIAGNOSIS — E7849 Other hyperlipidemia: Secondary | ICD-10-CM | POA: Diagnosis not present

## 2019-06-30 DIAGNOSIS — E1165 Type 2 diabetes mellitus with hyperglycemia: Secondary | ICD-10-CM | POA: Diagnosis not present

## 2019-06-30 DIAGNOSIS — E1140 Type 2 diabetes mellitus with diabetic neuropathy, unspecified: Secondary | ICD-10-CM | POA: Diagnosis not present

## 2019-06-30 DIAGNOSIS — I10 Essential (primary) hypertension: Secondary | ICD-10-CM | POA: Diagnosis not present

## 2019-06-30 LAB — NOVEL CORONAVIRUS, NAA: SARS-CoV-2, NAA: NOT DETECTED

## 2019-07-02 ENCOUNTER — Telehealth: Payer: Self-pay | Admitting: *Deleted

## 2019-07-02 NOTE — Telephone Encounter (Signed)
Patient notified of negative result- patient tested for screening- possible exposure. Advised to continue safe practices, call PCP for changes and get flu shot.

## 2019-07-30 DIAGNOSIS — E1165 Type 2 diabetes mellitus with hyperglycemia: Secondary | ICD-10-CM | POA: Diagnosis not present

## 2019-07-30 DIAGNOSIS — E7849 Other hyperlipidemia: Secondary | ICD-10-CM | POA: Diagnosis not present

## 2019-07-30 DIAGNOSIS — E1140 Type 2 diabetes mellitus with diabetic neuropathy, unspecified: Secondary | ICD-10-CM | POA: Diagnosis not present

## 2019-08-02 DIAGNOSIS — N400 Enlarged prostate without lower urinary tract symptoms: Secondary | ICD-10-CM | POA: Diagnosis not present

## 2019-08-02 DIAGNOSIS — N5201 Erectile dysfunction due to arterial insufficiency: Secondary | ICD-10-CM | POA: Diagnosis not present

## 2019-08-02 DIAGNOSIS — Z125 Encounter for screening for malignant neoplasm of prostate: Secondary | ICD-10-CM | POA: Diagnosis not present

## 2019-08-22 ENCOUNTER — Emergency Department (HOSPITAL_COMMUNITY): Payer: Medicare HMO

## 2019-08-22 ENCOUNTER — Encounter (HOSPITAL_COMMUNITY): Payer: Self-pay | Admitting: Emergency Medicine

## 2019-08-22 ENCOUNTER — Observation Stay (HOSPITAL_COMMUNITY)
Admission: EM | Admit: 2019-08-22 | Discharge: 2019-08-23 | Disposition: A | Discharge status: Home / Self Care | Payer: Medicare HMO | Attending: Family Medicine | Admitting: Family Medicine

## 2019-08-22 ENCOUNTER — Other Ambulatory Visit: Payer: Self-pay

## 2019-08-22 DIAGNOSIS — Z79899 Other long term (current) drug therapy: Secondary | ICD-10-CM | POA: Insufficient documentation

## 2019-08-22 DIAGNOSIS — Z79890 Hormone replacement therapy (postmenopausal): Secondary | ICD-10-CM | POA: Insufficient documentation

## 2019-08-22 DIAGNOSIS — R471 Dysarthria and anarthria: Secondary | ICD-10-CM | POA: Diagnosis not present

## 2019-08-22 DIAGNOSIS — R4789 Other speech disturbances: Secondary | ICD-10-CM | POA: Diagnosis not present

## 2019-08-22 DIAGNOSIS — Z20828 Contact with and (suspected) exposure to other viral communicable diseases: Secondary | ICD-10-CM | POA: Insufficient documentation

## 2019-08-22 DIAGNOSIS — E119 Type 2 diabetes mellitus without complications: Secondary | ICD-10-CM | POA: Diagnosis not present

## 2019-08-22 DIAGNOSIS — I6522 Occlusion and stenosis of left carotid artery: Secondary | ICD-10-CM | POA: Diagnosis not present

## 2019-08-22 DIAGNOSIS — Z794 Long term (current) use of insulin: Secondary | ICD-10-CM | POA: Diagnosis not present

## 2019-08-22 DIAGNOSIS — Z03818 Encounter for observation for suspected exposure to other biological agents ruled out: Secondary | ICD-10-CM | POA: Diagnosis not present

## 2019-08-22 DIAGNOSIS — R4781 Slurred speech: Secondary | ICD-10-CM | POA: Diagnosis not present

## 2019-08-22 DIAGNOSIS — G459 Transient cerebral ischemic attack, unspecified: Secondary | ICD-10-CM | POA: Diagnosis not present

## 2019-08-22 DIAGNOSIS — Z87891 Personal history of nicotine dependence: Secondary | ICD-10-CM | POA: Diagnosis not present

## 2019-08-22 DIAGNOSIS — Z7982 Long term (current) use of aspirin: Secondary | ICD-10-CM | POA: Insufficient documentation

## 2019-08-22 DIAGNOSIS — Z923 Personal history of irradiation: Secondary | ICD-10-CM | POA: Diagnosis not present

## 2019-08-22 DIAGNOSIS — I7771 Dissection of carotid artery: Secondary | ICD-10-CM

## 2019-08-22 DIAGNOSIS — R479 Unspecified speech disturbances: Secondary | ICD-10-CM | POA: Diagnosis present

## 2019-08-22 DIAGNOSIS — I671 Cerebral aneurysm, nonruptured: Secondary | ICD-10-CM | POA: Diagnosis not present

## 2019-08-22 DIAGNOSIS — I10 Essential (primary) hypertension: Secondary | ICD-10-CM | POA: Diagnosis not present

## 2019-08-22 DIAGNOSIS — Z85818 Personal history of malignant neoplasm of other sites of lip, oral cavity, and pharynx: Secondary | ICD-10-CM | POA: Insufficient documentation

## 2019-08-22 DIAGNOSIS — R29818 Other symptoms and signs involving the nervous system: Secondary | ICD-10-CM | POA: Diagnosis not present

## 2019-08-22 DIAGNOSIS — I6529 Occlusion and stenosis of unspecified carotid artery: Secondary | ICD-10-CM

## 2019-08-22 LAB — DIFFERENTIAL
Abs Immature Granulocytes: 0.02 10*3/uL (ref 0.00–0.07)
Basophils Absolute: 0 10*3/uL (ref 0.0–0.1)
Basophils Relative: 1 %
Eosinophils Absolute: 0 10*3/uL (ref 0.0–0.5)
Eosinophils Relative: 1 %
Immature Granulocytes: 1 %
Lymphocytes Relative: 24 %
Lymphs Abs: 0.9 10*3/uL (ref 0.7–4.0)
Monocytes Absolute: 0.3 10*3/uL (ref 0.1–1.0)
Monocytes Relative: 9 %
Neutro Abs: 2.4 10*3/uL (ref 1.7–7.7)
Neutrophils Relative %: 64 %

## 2019-08-22 LAB — COMPREHENSIVE METABOLIC PANEL
ALT: 27 U/L (ref 0–44)
AST: 21 U/L (ref 15–41)
Albumin: 4 g/dL (ref 3.5–5.0)
Alkaline Phosphatase: 51 U/L (ref 38–126)
Anion gap: 10 (ref 5–15)
BUN: 12 mg/dL (ref 8–23)
CO2: 27 mmol/L (ref 22–32)
Calcium: 9.3 mg/dL (ref 8.9–10.3)
Chloride: 101 mmol/L (ref 98–111)
Creatinine, Ser: 0.96 mg/dL (ref 0.61–1.24)
GFR calc Af Amer: >60 mL/min (ref >60)
GFR calc non Af Amer: >60 mL/min (ref >60)
Glucose, Bld: 274 mg/dL — ABNORMAL HIGH (ref 70–99)
Potassium: 4.3 mmol/L (ref 3.5–5.1)
Sodium: 138 mmol/L (ref 135–145)
Total Bilirubin: 0.6 mg/dL (ref 0.3–1.2)
Total Protein: 7 g/dL (ref 6.5–8.1)

## 2019-08-22 LAB — RAPID URINE DRUG SCREEN, HOSP PERFORMED
Amphetamines: NOT DETECTED
Barbiturates: NOT DETECTED
Benzodiazepines: NOT DETECTED
Cocaine: NOT DETECTED
Opiates: NOT DETECTED
Tetrahydrocannabinol: NOT DETECTED

## 2019-08-22 LAB — CBC
HCT: 41.5 % (ref 39.0–52.0)
Hemoglobin: 14.2 g/dL (ref 13.0–17.0)
MCH: 32.3 pg (ref 26.0–34.0)
MCHC: 34.2 g/dL (ref 30.0–36.0)
MCV: 94.3 fL (ref 80.0–100.0)
Platelets: 252 10*3/uL (ref 150–400)
RBC: 4.4 MIL/uL (ref 4.22–5.81)
RDW: 12.1 % (ref 11.5–15.5)
WBC: 3.7 10*3/uL — ABNORMAL LOW (ref 4.0–10.5)
nRBC: 0 % (ref 0.0–0.2)

## 2019-08-22 LAB — ETHANOL: Alcohol, Ethyl (B): <10 mg/dL (ref <10)

## 2019-08-22 LAB — URINALYSIS, ROUTINE W REFLEX MICROSCOPIC
Bacteria, UA: NONE SEEN
Bilirubin Urine: NEGATIVE
Glucose, UA: >=500 mg/dL — AB
Hgb urine dipstick: NEGATIVE
Ketones, ur: NEGATIVE mg/dL
Leukocytes,Ua: NEGATIVE
Nitrite: NEGATIVE
Protein, ur: NEGATIVE mg/dL
Specific Gravity, Urine: 1.002 — ABNORMAL LOW (ref 1.005–1.030)
pH: 6 (ref 5.0–8.0)

## 2019-08-22 LAB — PROTIME-INR
INR: 1.1 (ref 0.8–1.2)
Prothrombin Time: 14.5 seconds (ref 11.4–15.2)

## 2019-08-22 LAB — SARS CORONAVIRUS 2 (TAT 6-24 HRS): SARS Coronavirus 2: NEGATIVE

## 2019-08-22 LAB — APTT: aPTT: 47 seconds — ABNORMAL HIGH (ref 24–36)

## 2019-08-22 IMAGING — MR MR MRA HEAD W/O CM
1 series · 16 of 48 positions shown · non-contrast
Comparison: Brain MRI and head CT earlier today.

CLINICAL DATA: 68-year-old male with abnormal speech, stroke
symptoms.

EXAM:
MRA HEAD WITHOUT CONTRAST
TECHNIQUE: Angiographic images of the Circle of Willis were obtained using MRA
technique without intravenous contrast.

[Series 1: TOF fat-sat · axial · 0.8mm · 0.38mm/px · z∈[-54,+45]mm · 16 of 131 slices shown]
[im 1/131]
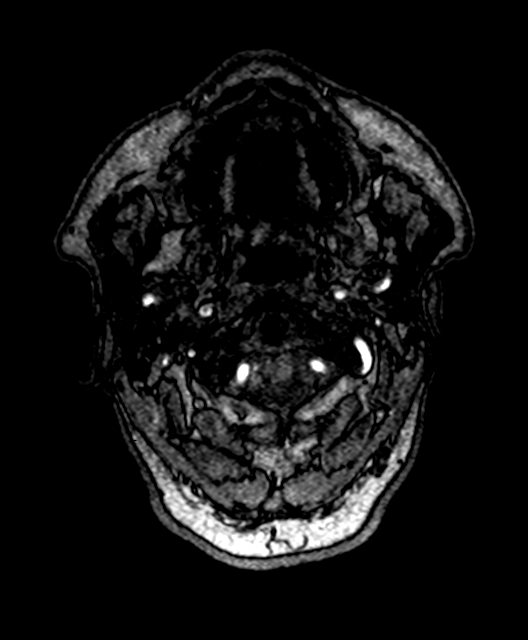
[im 3/131]
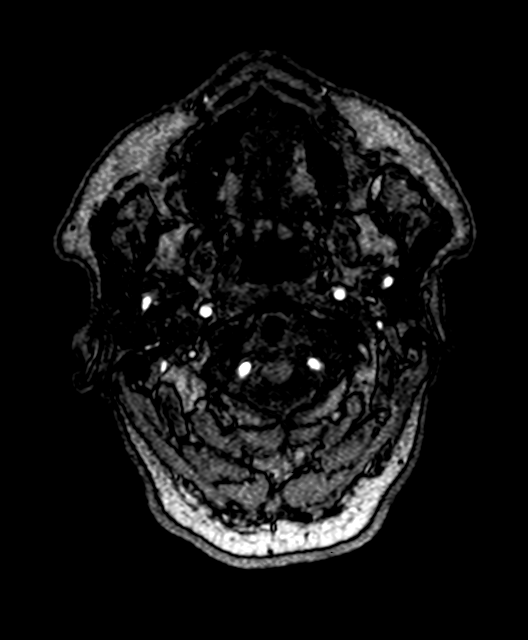
[im 6/131]
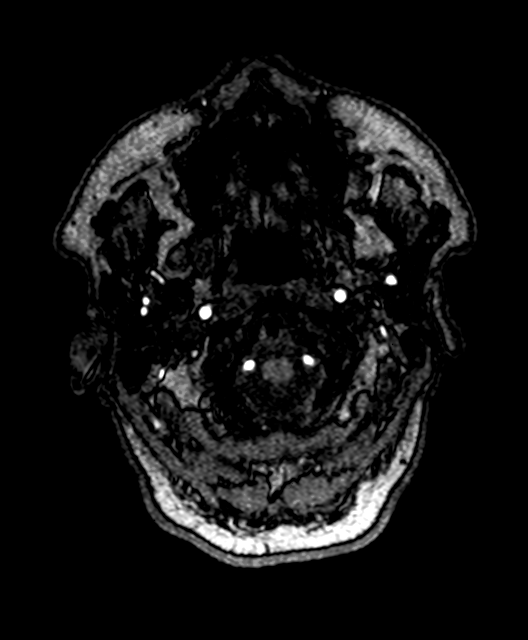
[im 9/131]
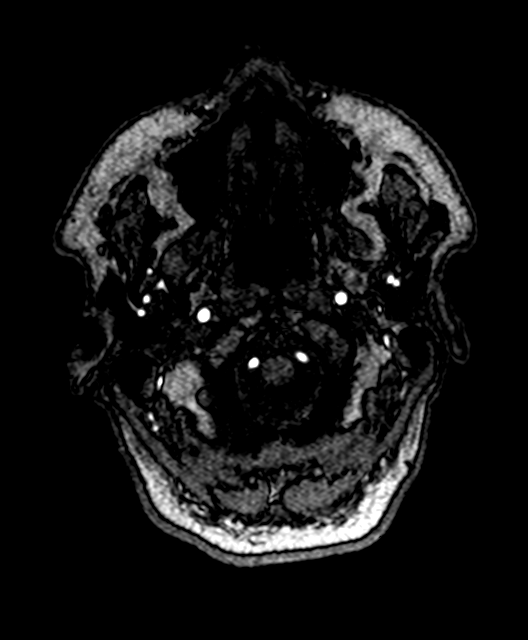
[im 12/131]
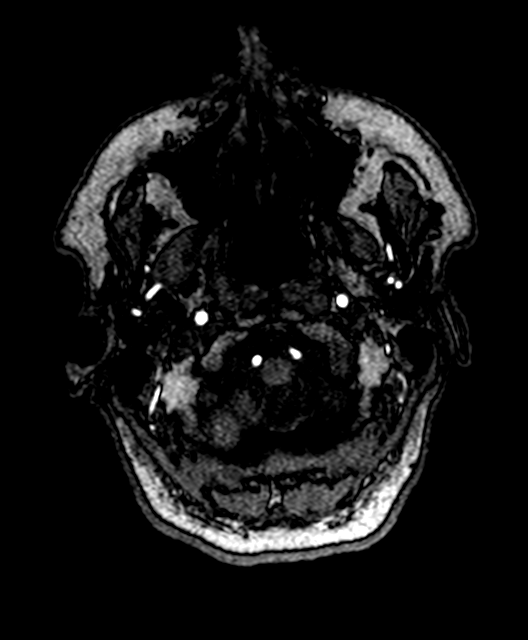
[im 14/131]
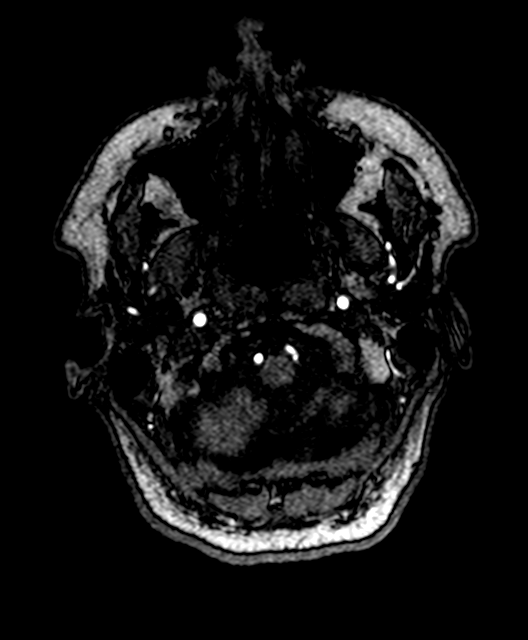
[im 23/131]
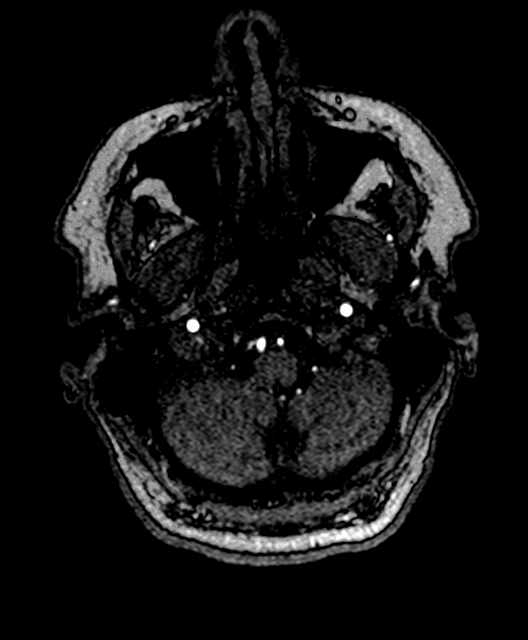
[im 25/131]
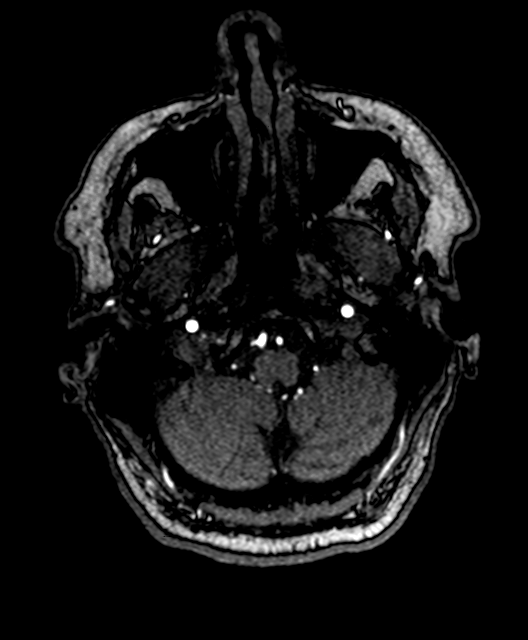
[im 42/131]
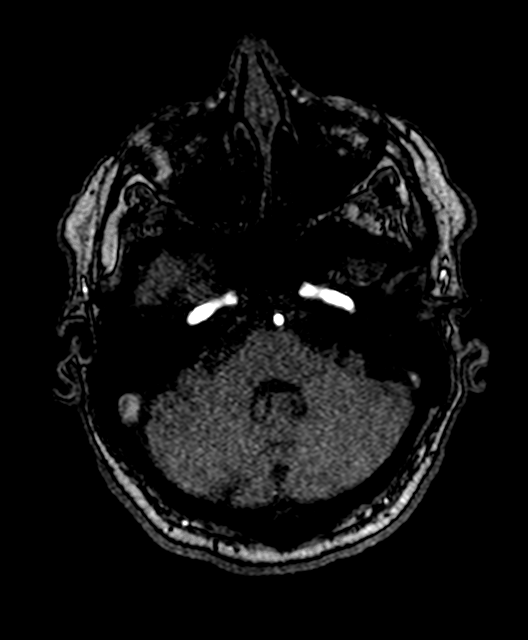
[im 59/131]
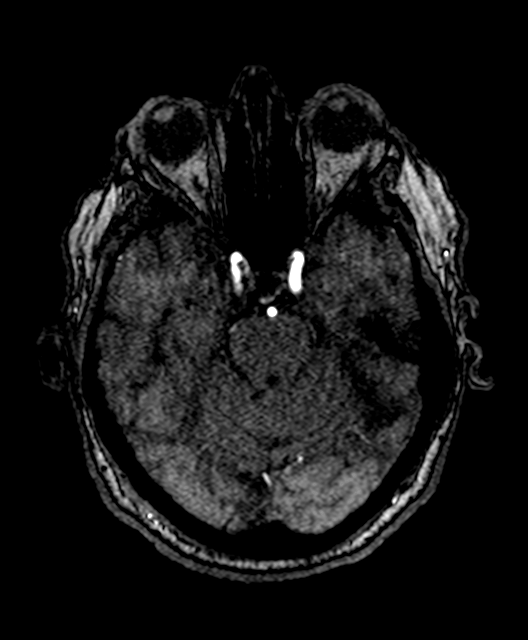
[im 67/131]
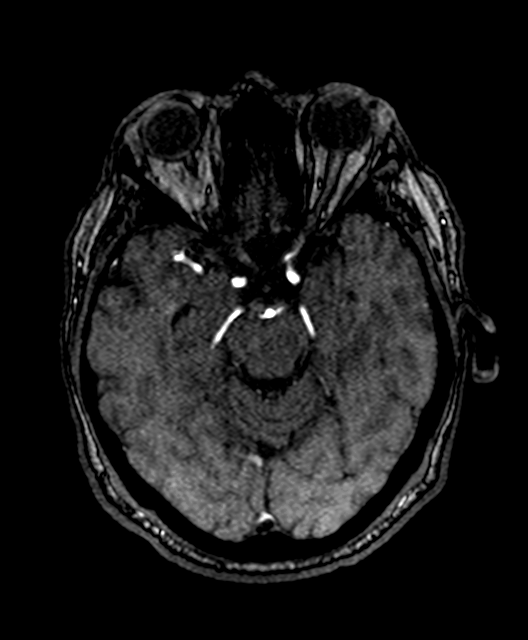
[im 75/131]
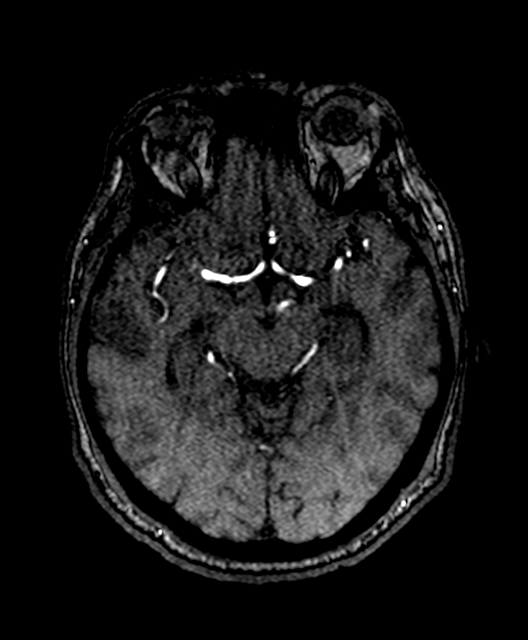
[im 92/131]
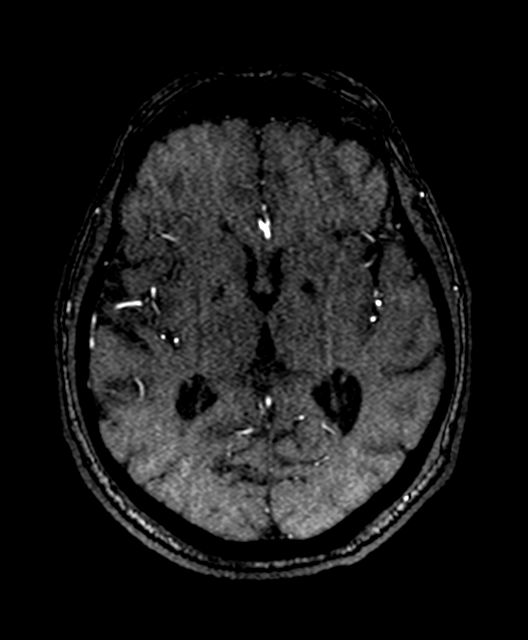
[im 108/131]
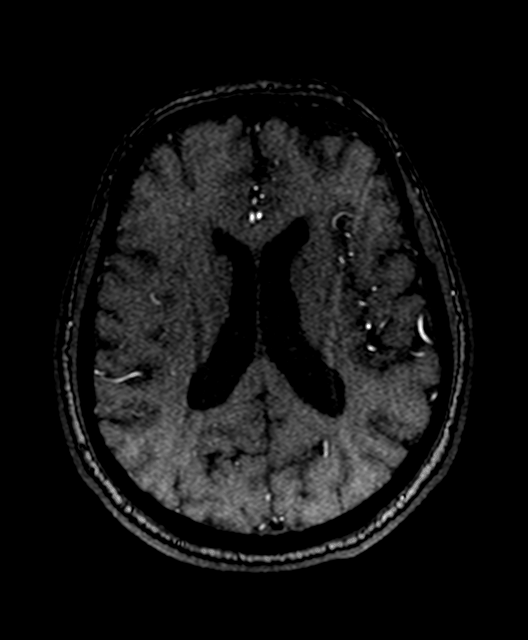
[im 111/131]
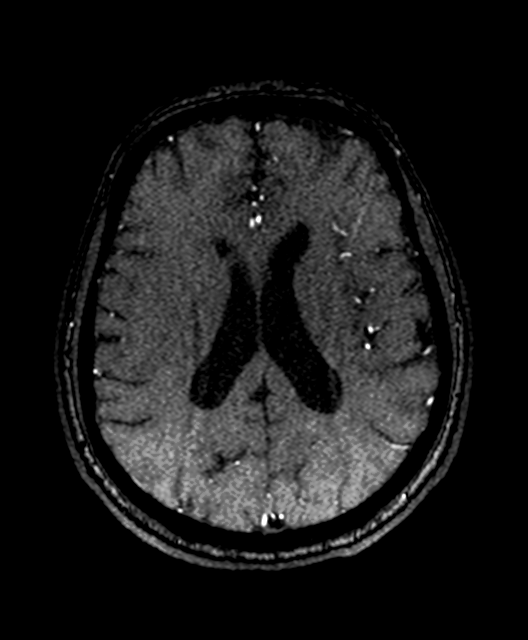
[im 125/131]
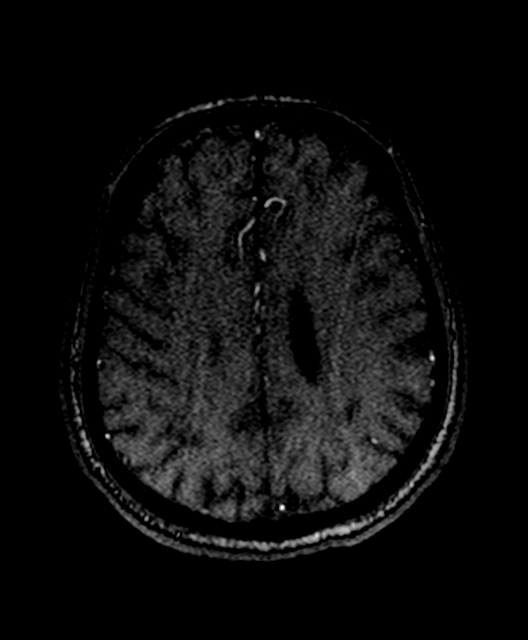

[16 of 48 positions shown; findings below may reference images not displayed]

FINDINGS: Antegrade flow in the posterior circulation with mildly dominant
distal right vertebral artery. No distal vertebral or
vertebrobasilar junction stenosis. Patent PICA origins. Patent
basilar artery without stenosis. Normal SCA and PCA origins.
Posterior communicating arteries are diminutive or absent. Bilateral
PCA branches are within normal limits.

Antegrade flow in both ICA siphons. No siphon stenosis. Ectasia of
the right ophthalmic artery origin is suspected. No ICA siphon
aneurysm. Patent carotid termini. Normal MCA and ACA origins.
Anterior communicating artery diminutive or absent. Visible ACA
branches are within normal limits. Bilateral MCA M1 segments and MCA
bi/trifurcations are patent without stenosis. Visible MCA branches
are within normal limits.
IMPRESSION: Negative intracranial MRA.

## 2019-08-22 IMAGING — MR MR HEAD W/O CM
8 of 10 series · 33 of 48 positions shown · non-contrast
Comparison: Head CT earlier today.

CLINICAL DATA: 68-year-old male with abnormal speech, stroke
symptoms.

EXAM:
MRI HEAD WITHOUT CONTRAST
TECHNIQUE: Multiplanar, multiecho pulse sequences of the brain and surrounding
structures were obtained without intravenous contrast.

[Series 2: T1 · sagittal · 5.0mm · 0.41mm/px · 4 of 24 slices shown (1 of 2)]
[im 1/24]
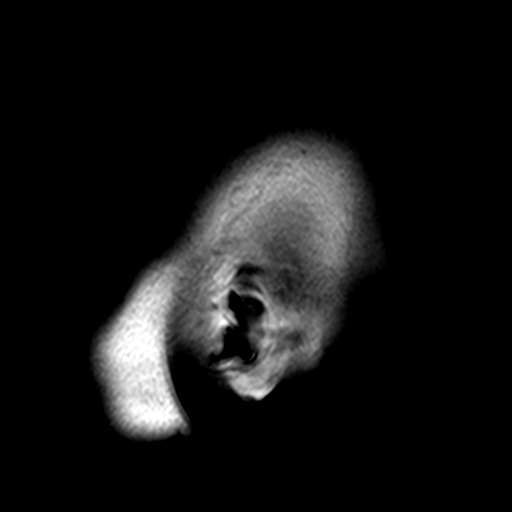
[im 8/24]
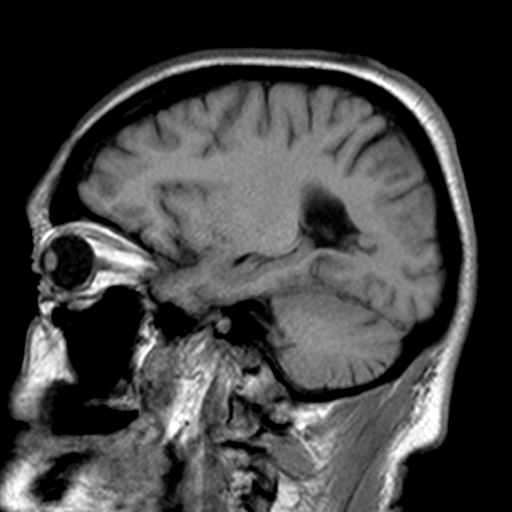
[im 16/24]
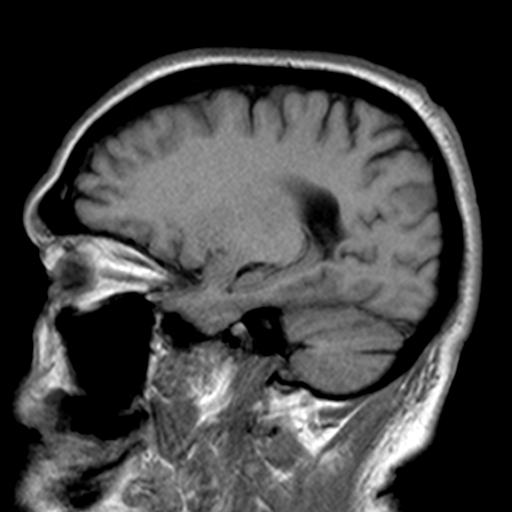
[im 24/24]
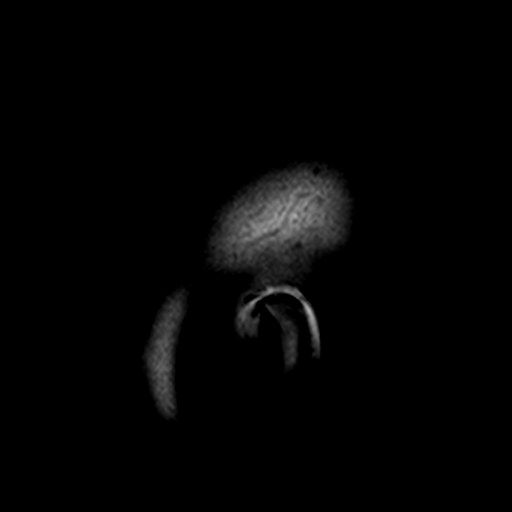

[Series 3: DWI · axial · 3.0mm · 0.72mm/px · z∈[-58,+97]mm · 6 of 53 slices shown (1 of 2)]
[im 1/53]
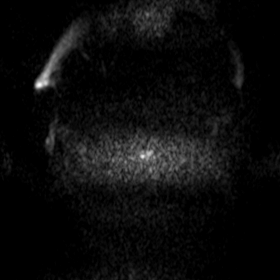
[im 11/53]
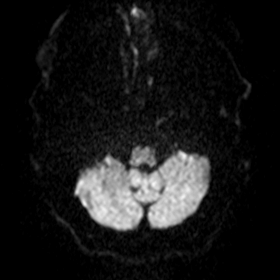
[im 21/53]
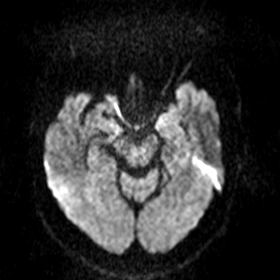
[im 32/53]
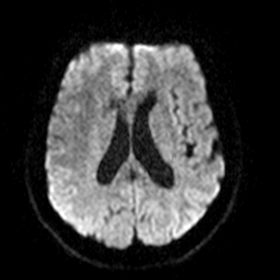
[im 42/53]
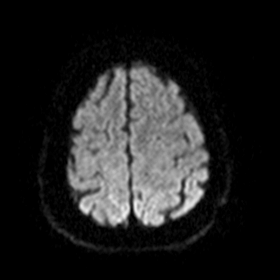
[im 53/53]
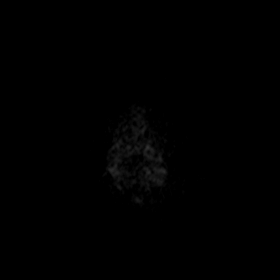

[Series 5: DWI · coronal · 5.0mm · 0.49mm/px · 4 of 36 slices shown (2 of 2)]
[im 1/36]
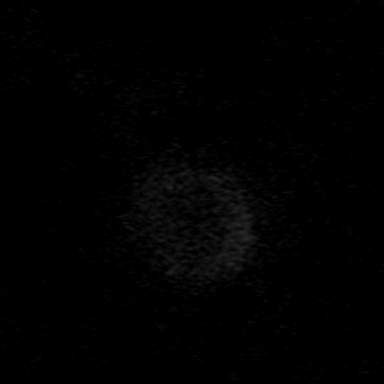
[im 12/36]
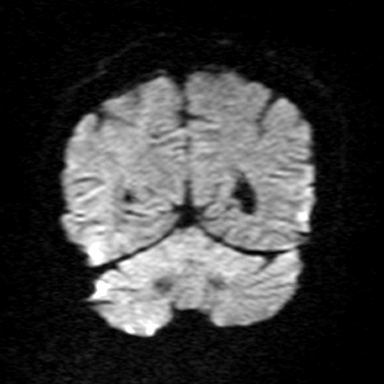
[im 24/36]
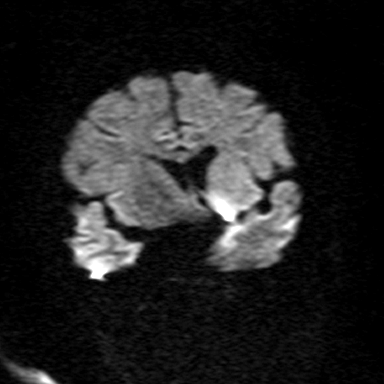
[im 36/36]
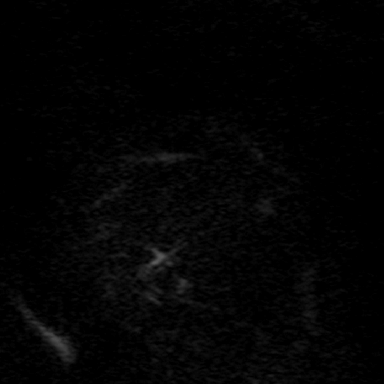

[Series 7: T2 · axial · 5.0mm · 0.67mm/px · z∈[-57,+98]mm · 3 of 25 slices shown (1 of 3)]
[im 1/25]
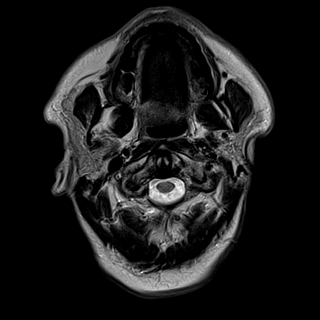
[im 13/25]
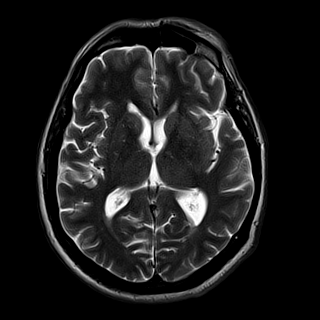
[im 25/25]
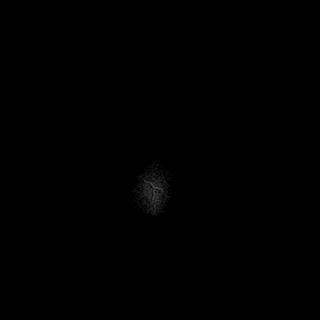

[Series 8: T2 · axial · 5.0mm · 0.42mm/px · z∈[-51,+91]mm · 3 of 23 slices shown (2 of 3)]
[im 1/23]
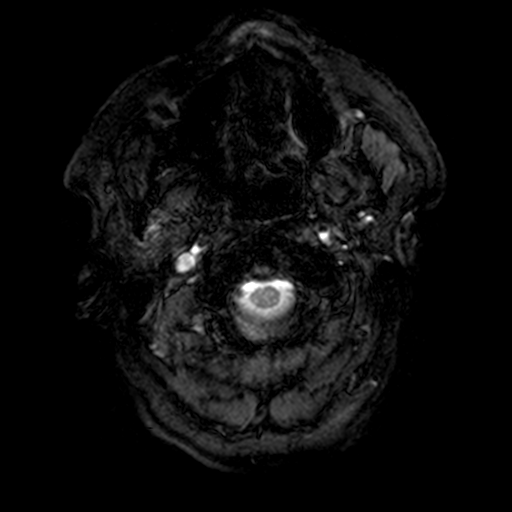
[im 12/23]
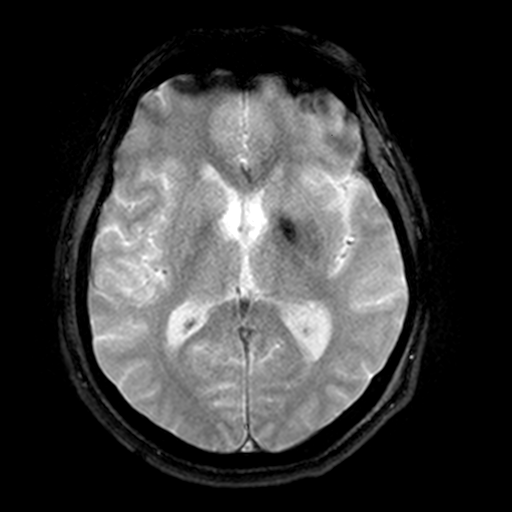
[im 23/23]
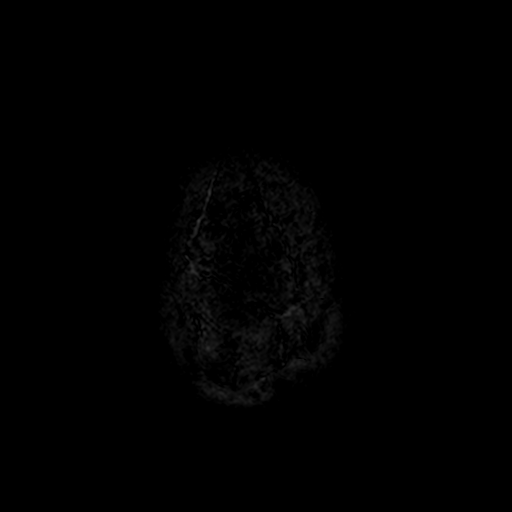

[Series 9: FLAIR · axial · 3.0mm · 0.85mm/px · z∈[-51,+92]mm · 6 of 49 slices shown]
[im 1/49]
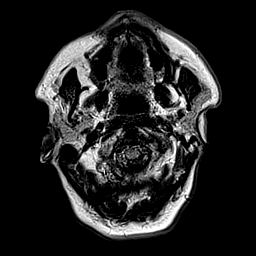
[im 10/49]
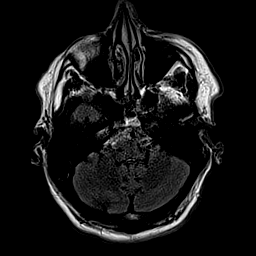
[im 20/49]
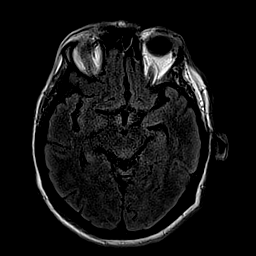
[im 29/49]
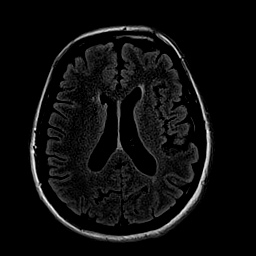
[im 39/49]
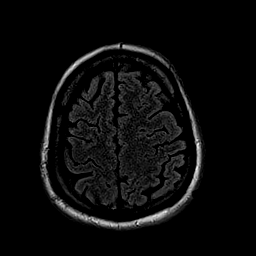
[im 49/49]
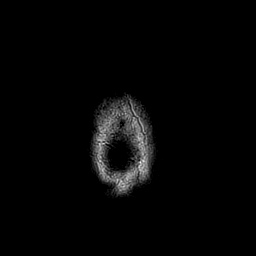

[Series 10: T1 · axial · 2.0mm · 0.42mm/px · z∈[-53,+1]mm · 4 of 74 slices shown (2 of 2)]
[im 1/74]
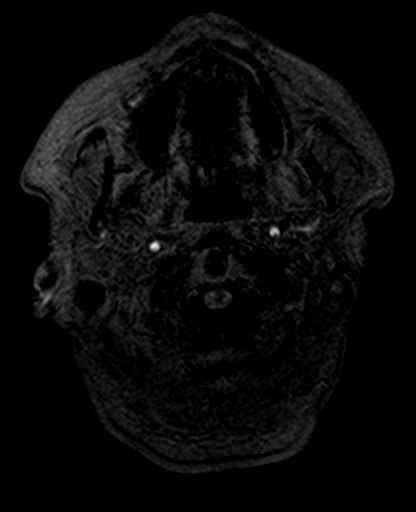
[im 10/74]
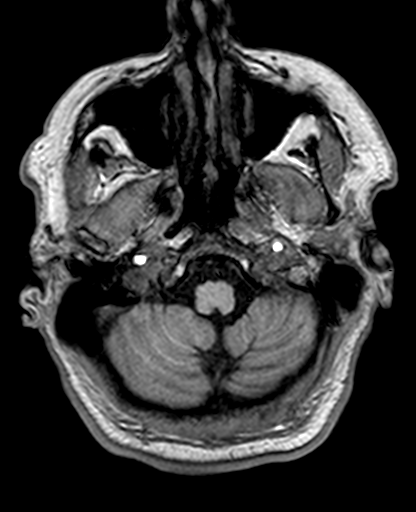
[im 19/74]
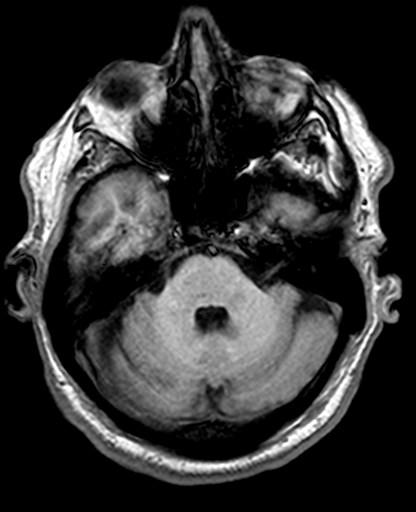
[im 28/74]
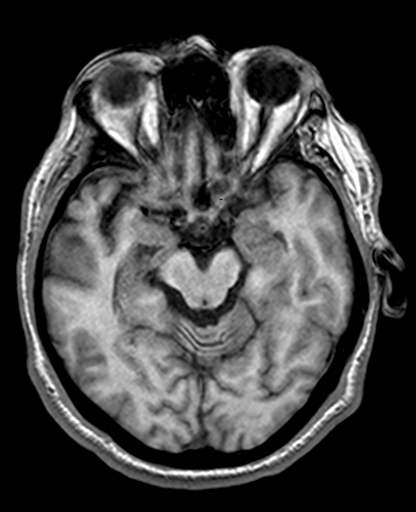

[Series 11: T2 · coronal · 5.0mm · 0.54mm/px · 3 of 28 slices shown (3 of 3)]
[im 1/28]
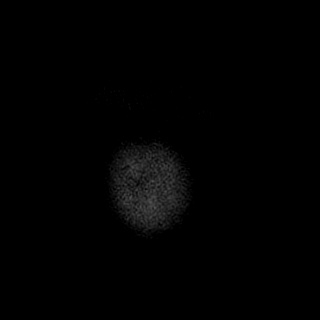
[im 14/28]
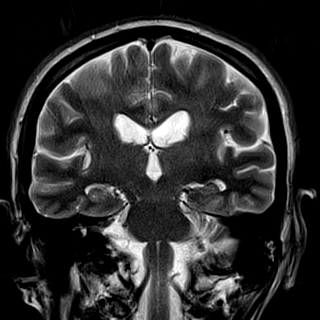
[im 28/28]
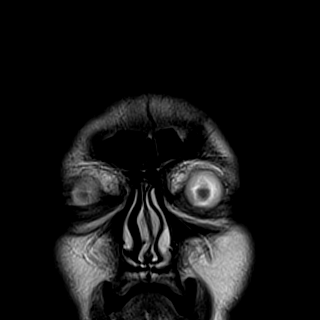

[33 of 48 positions shown; findings below may reference images not displayed]

FINDINGS: Brain: No restricted diffusion or evidence of acute infarction.

No midline shift, mass effect, evidence of mass lesion,
ventriculomegaly, extra-axial collection or acute intracranial
hemorrhage. Cervicomedullary junction and pituitary are within
normal limits. A small intraventricular adhesion is suspected near
the right frontal horn (series 7, image 15, normal variant).

There is abnormal T2 and FLAIR hyperintensity in the left cerebellar
peduncle on series 7 image 7 where diffusion appears facilitated
(series 4, image 14). The abnormality is about 6-7 millimeters. The
trace diffusion image does appear mildly abnormal here. T1 signal is
also mildly decreased. No associated hemorrhage.

Elsewhere gray and white matter signal appears within normal limits
for age, including in the brainstem. No cortical encephalomalacia or
chronic cerebral blood products identified. Cerebral volume appears
within normal limits for age.

Vascular: Major intracranial vascular flow voids are preserved.

Skull and upper cervical spine: Negative visible cervical spine.
Visualized bone marrow signal is within normal limits.

Sinuses/Orbits: Negative orbits. Paranasal sinuses and mastoids are
stable and well pneumatized.

Other: Visible internal auditory structures appear normal. Scalp and
face soft tissues appear negative.
IMPRESSION: No acute infarct identified, but there is a solitary 7 mm area of
nonspecific signal abnormality in the left cerebellar peduncle.
Elsewhere the brain appears within normal limits for age.

Follow-up post-contrast imaging is recommended to exclude neoplasm.
But otherwise this resembles a nonspecific area of gliosis, with no
associated mass effect, hemosiderin or mineralization. No associated
brainstem or cerebellar abnormality.

## 2019-08-22 IMAGING — CT CT HEAD W/O CM
3 series · 16 of 47 positions shown, 19 images · non-contrast
Comparison: [DATE]

CLINICAL DATA: Speech difficulty.

EXAM:
CT HEAD WITHOUT CONTRAST
TECHNIQUE: Contiguous axial images were obtained from the base of the skull
through the vertex without intravenous contrast.

[Series 2: head w o · axial · 0.41mm/px · z∈[+14,+139]mm · 10 of 30 slices shown, 13 images]
[im 3/30  brain]
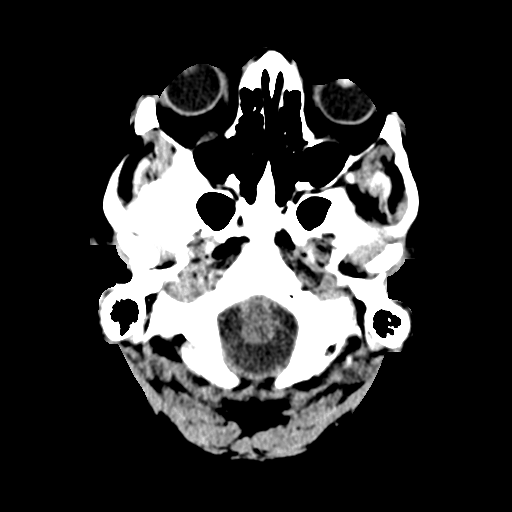
[im 3/30  bone]
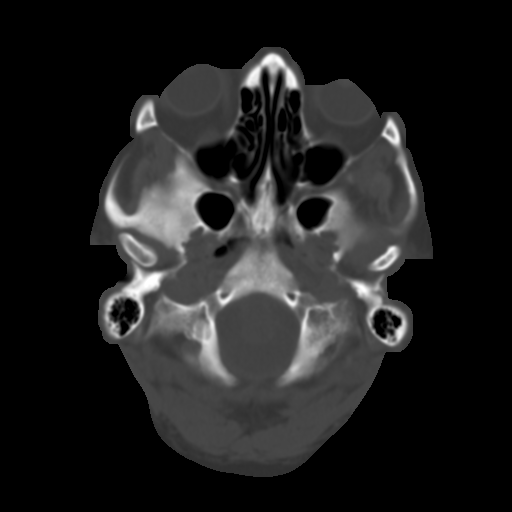
[im 6/30  brain]
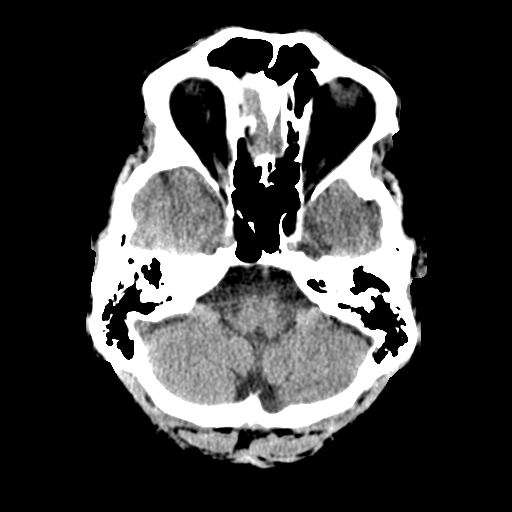
[im 9/30  brain]
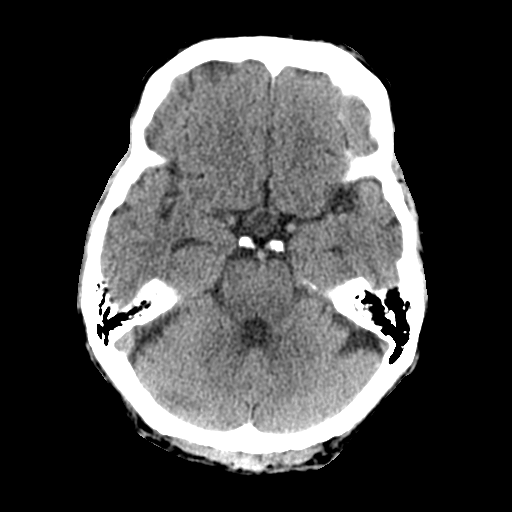
[im 11/30  brain]
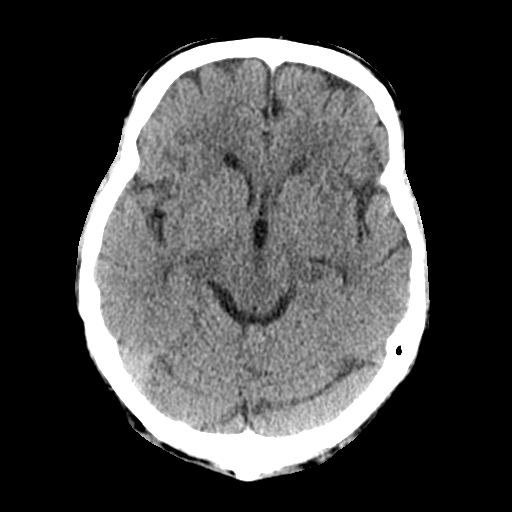
[im 14/30  brain]
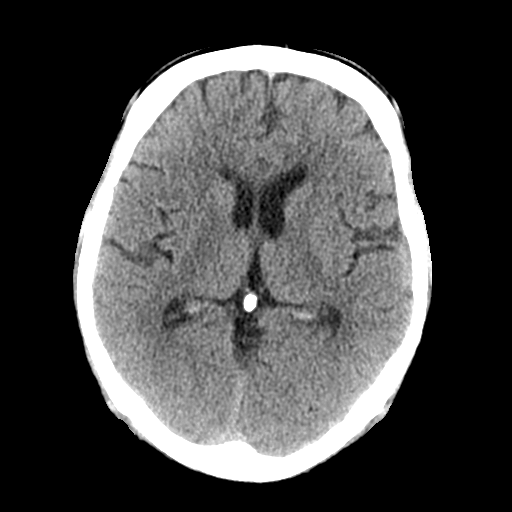
[im 14/30  bone]
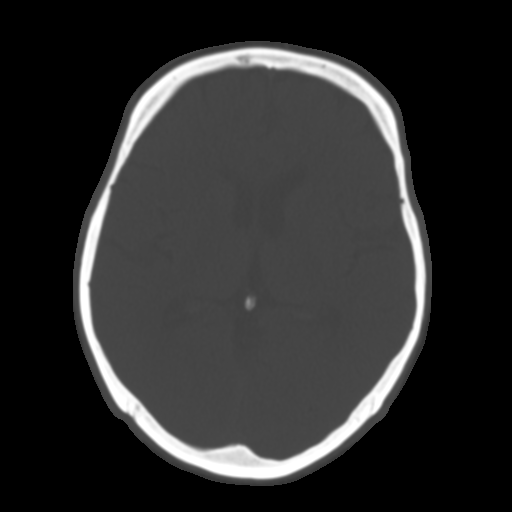
[im 17/30  brain]
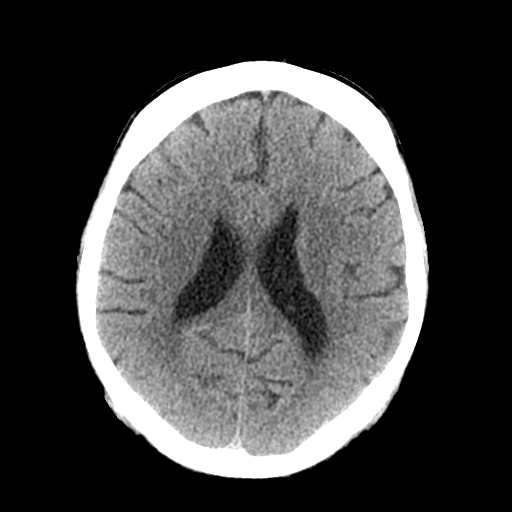
[im 20/30  brain]
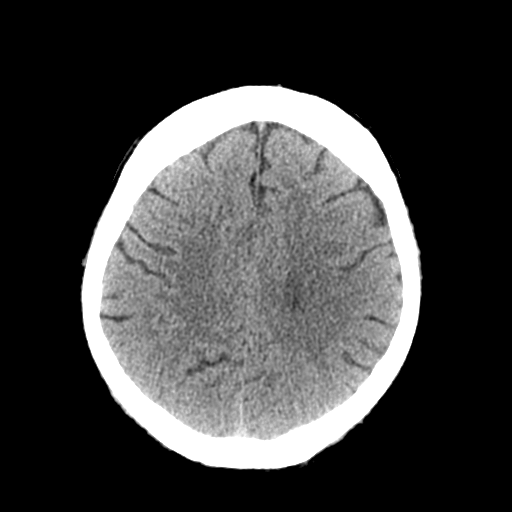
[im 23/30  brain]
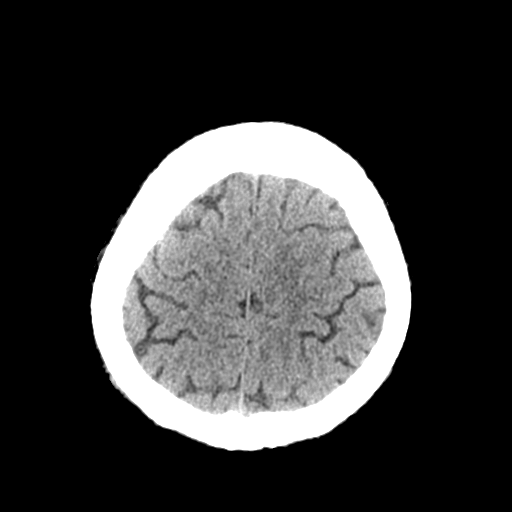
[im 25/30  brain]
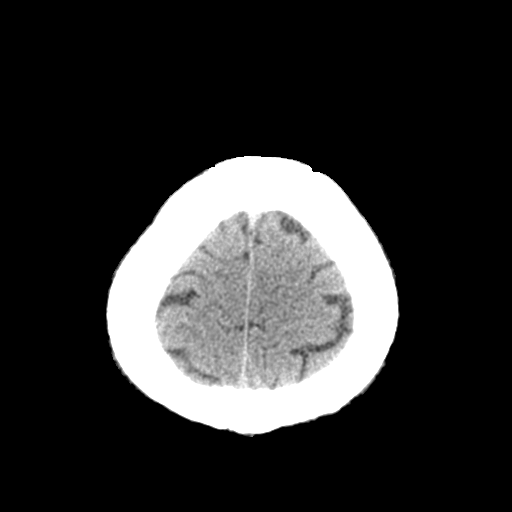
[im 25/30  bone]
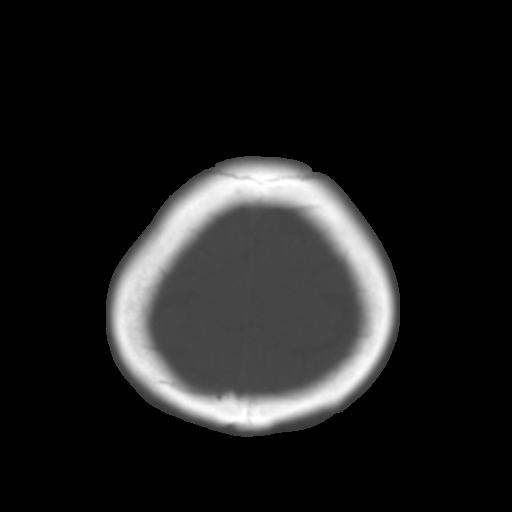
[im 28/30  brain]
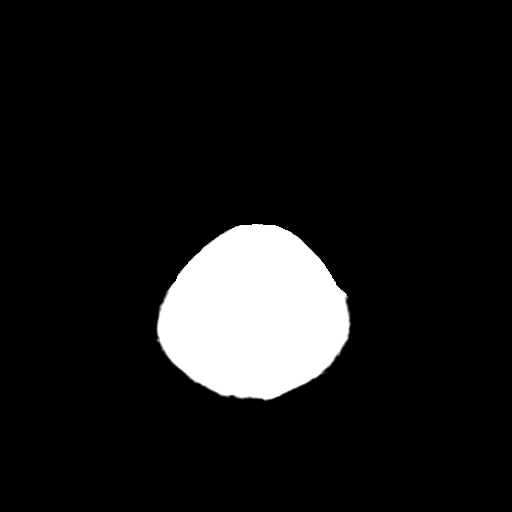

[Series 4: coronal soft · coronal · 0.32mm/px · 3 of 72 slices shown]
[im 24/72  brain]
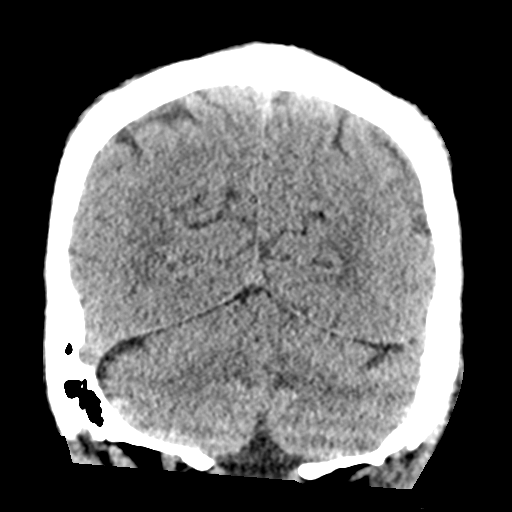
[im 32/72  brain]
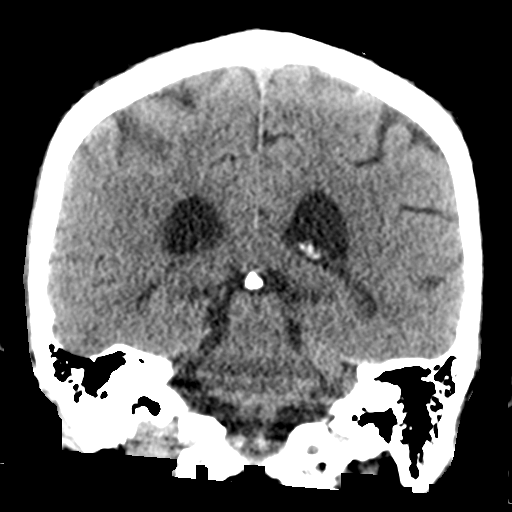
[im 40/72  brain]
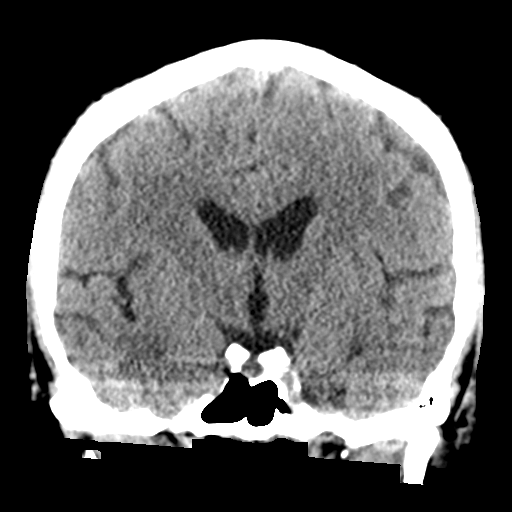

[Series 5: sagittal soft · sagittal · 0.34mm/px · 3 of 55 slices shown]
[im 19/55  brain]
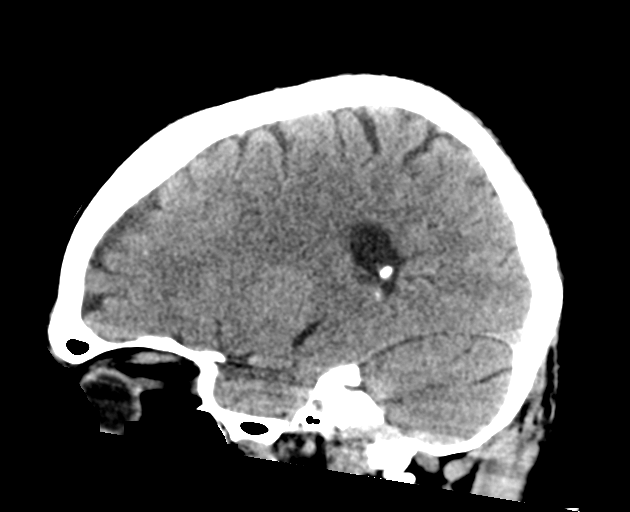
[im 28/55  brain]
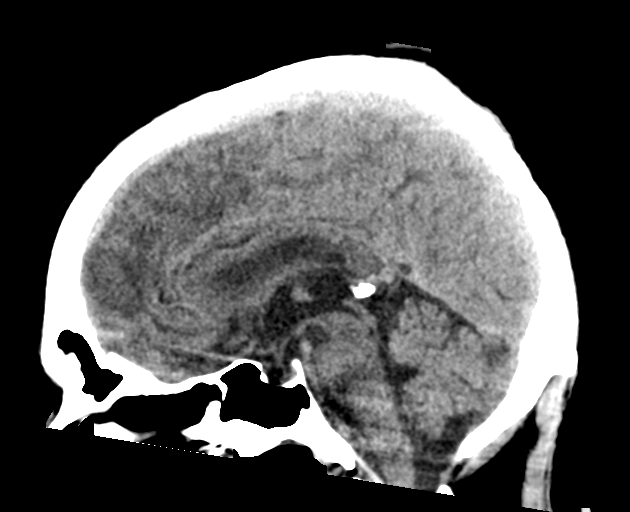
[im 37/55  brain]
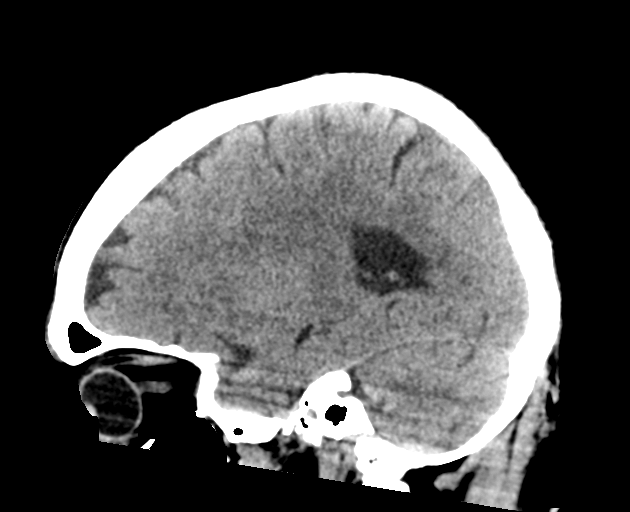

[16 of 47 positions shown; findings below may reference images not displayed]

FINDINGS: Brain: There is no evidence of acute infarct, intracranial
hemorrhage, mass, midline shift, or extra-axial fluid collection.
The ventricles and sulci are normal. Bilateral globus pallidus
mineralization has slightly progressed.

Vascular: Calcified atherosclerosis at the skull base. No hyperdense
vessel.

Skull: No fracture or focal osseous lesion.

Sinuses/Orbits: Visualized paranasal sinuses and mastoid air cells
are clear. Orbits are unremarkable.

Other: None.
IMPRESSION: No evidence of acute intracranial abnormality.

## 2019-08-22 IMAGING — MR MR MRA NECK WO/W CM
3 series · 18 of 48 positions shown · IV contrast (gadavist)
Comparison: Brain MRI and intracranial MRA today reported
separately.
COMPARISON: Brain MRI and intracranial MRA today reported
separately.

Addendum:
CLINICAL DATA: 68-year-old male with abnormal speech. Stroke-like
symptoms.

EXAM:
MRA NECK WITHOUT AND WITH CONTRAST
TECHNIQUE: Multiplanar and multiecho pulse sequences of the neck were obtained
without and with intravenous contrast. Angiographic images of the
neck were obtained using MRA technique without and with intravenous
contrast.
CONTRAST:  8mL GADAVIST GADOBUTROL 1 MMOL/ML IV SOLN

[Series 4: TOF · axial · 1.0mm · 0.52mm/px · z∈[-164,-65]mm · 9 of 112 slices shown]
[im 6/112]
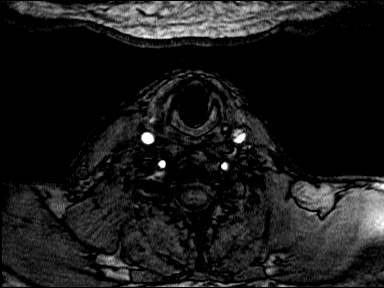
[im 18/112]
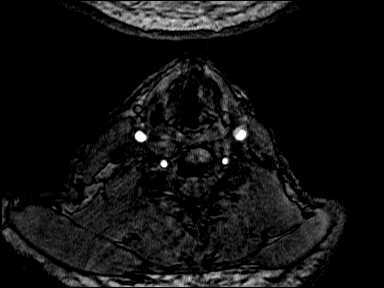
[im 36/112]
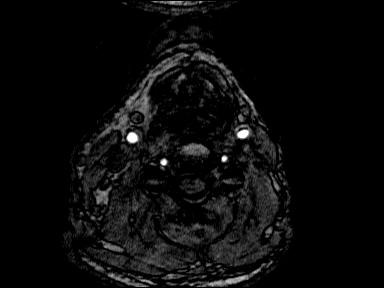
[im 47/112]
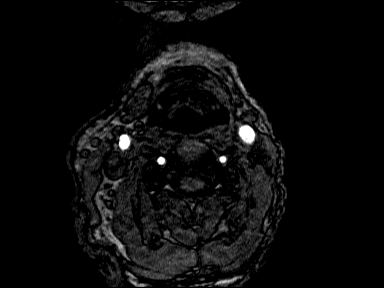
[im 59/112]
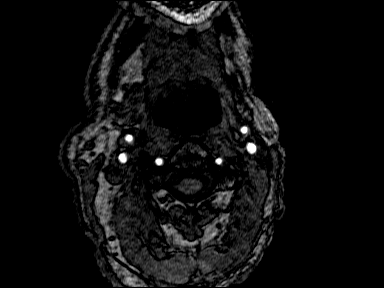
[im 65/112]
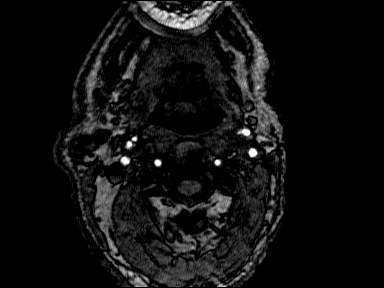
[im 76/112]
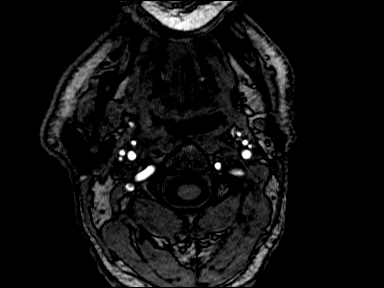
[im 94/112]
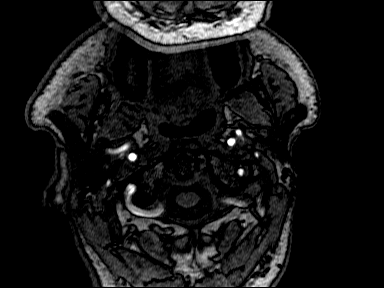
[im 106/112]
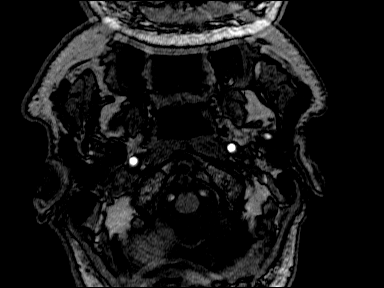

[Series 7: neck mask · coronal · 1.2mm · 0.41mm/px · 6 of 80 slices shown]
[im 1/80]
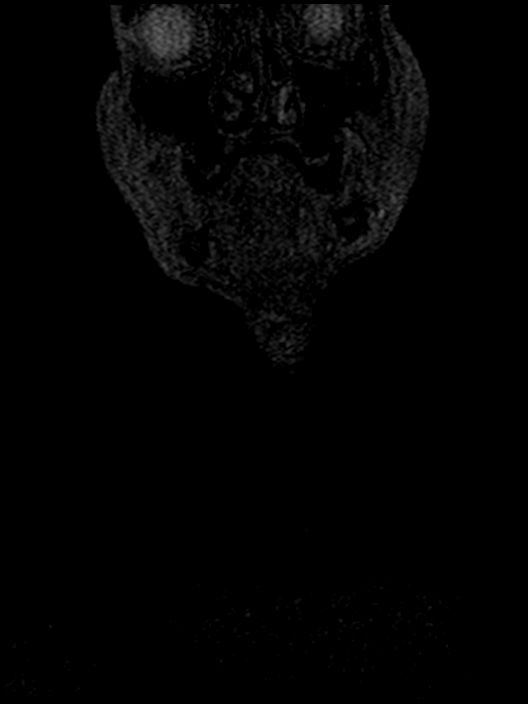
[im 13/80]
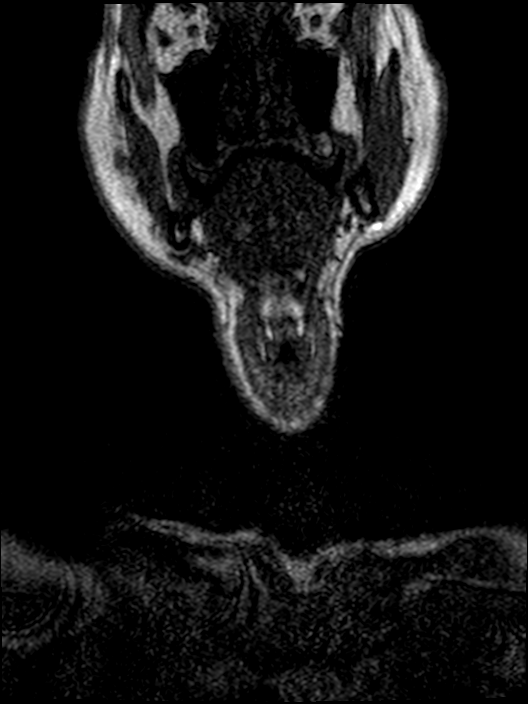
[im 25/80]
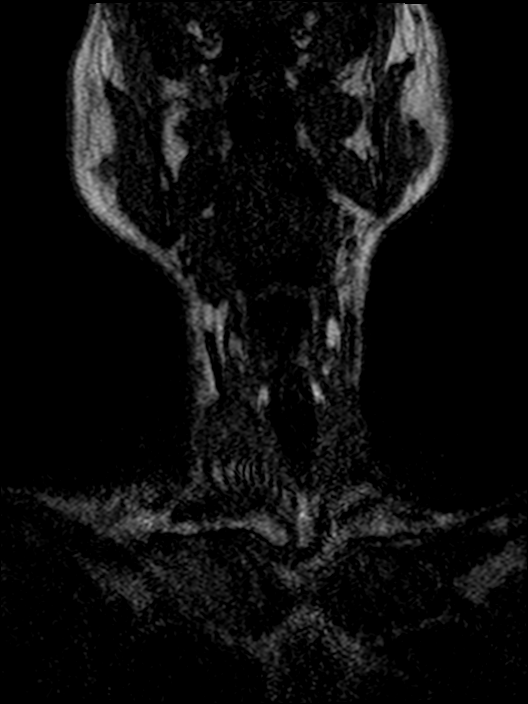
[im 37/80]
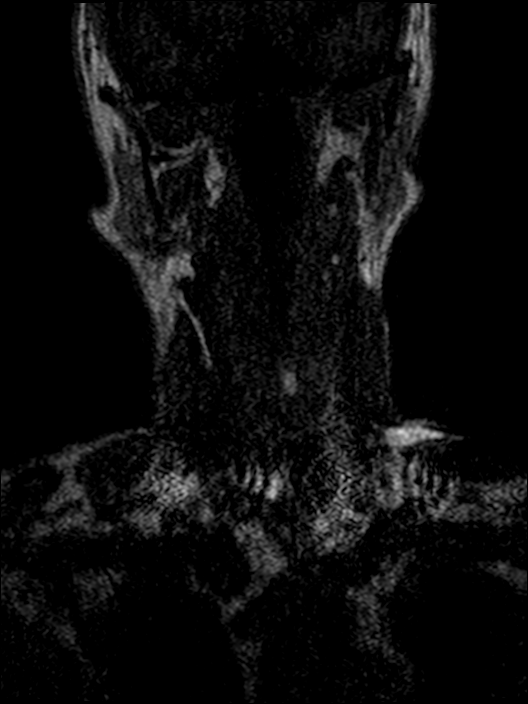
[im 43/80]
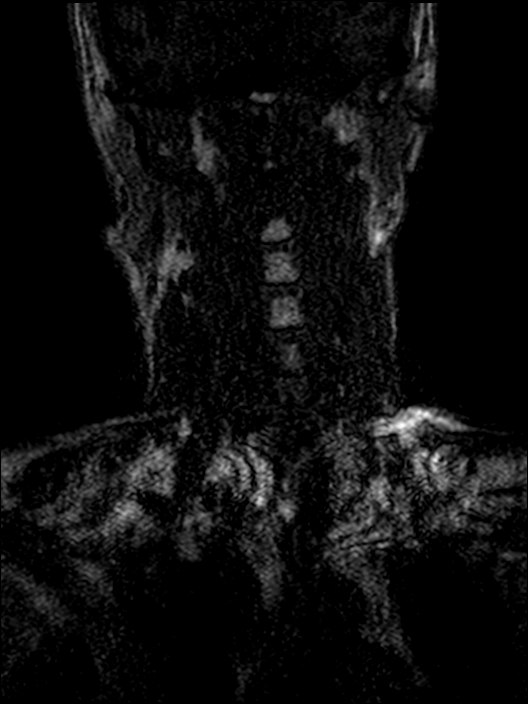
[im 67/80]
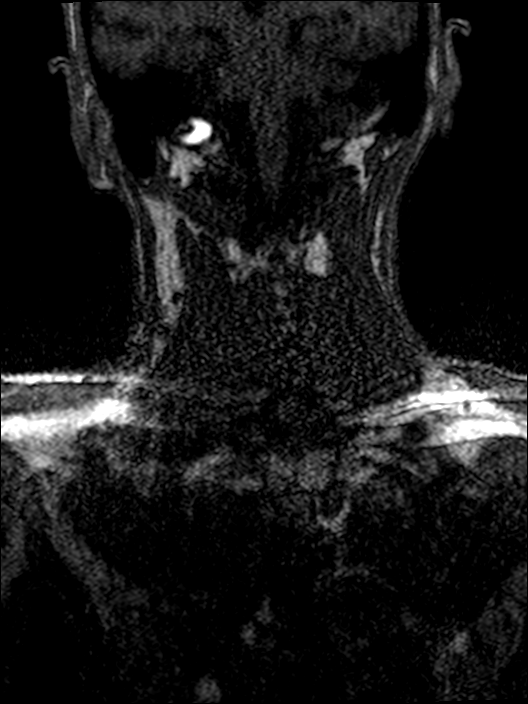

[Series 9: MRA · coronal · 1.2mm · 0.41mm/px · 3 of 77 slices shown]
[im 12/77]
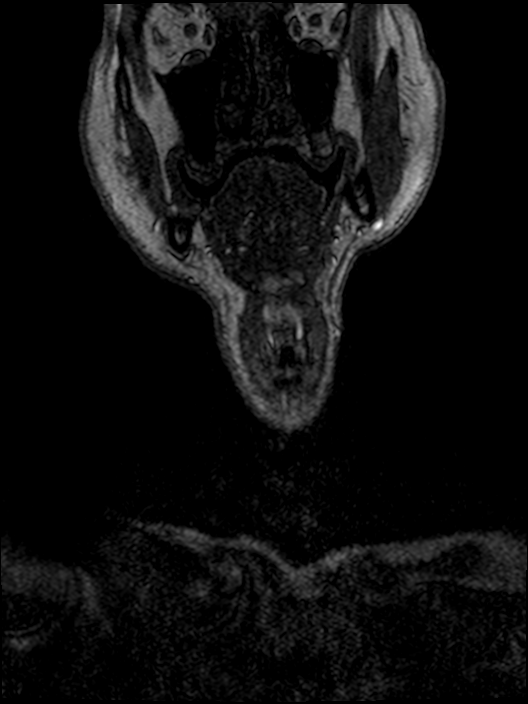
[im 41/77]
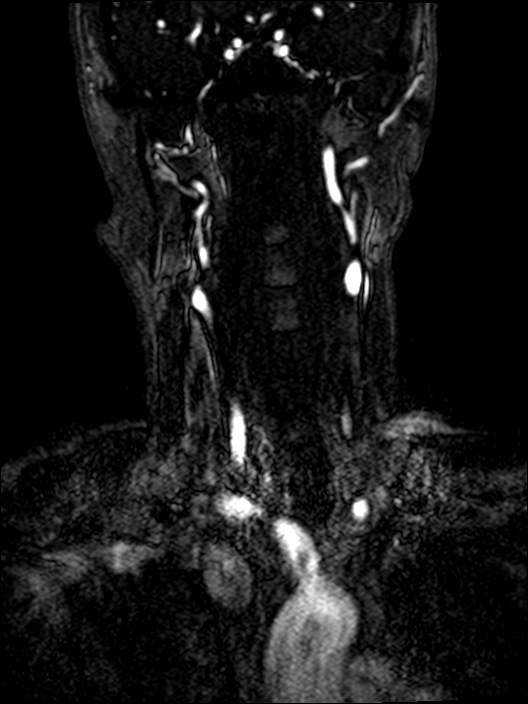
[im 65/77]
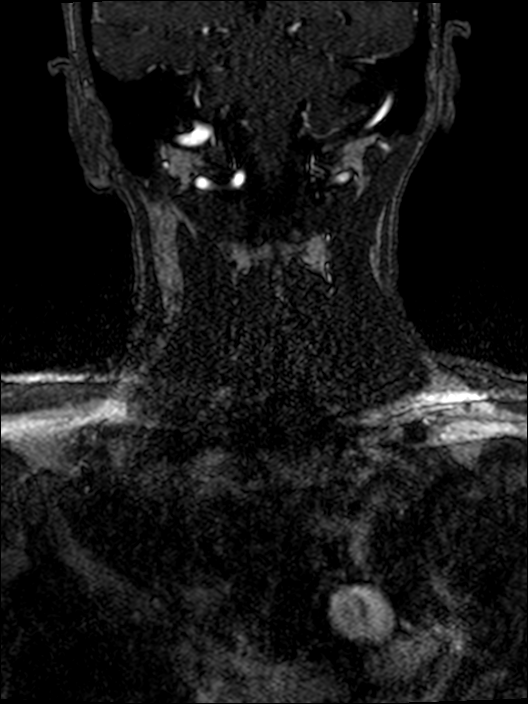

[18 of 48 positions shown; findings below may reference images not displayed]

FINDINGS: Precontrast time-of-flight imaging demonstrates antegrade flow in
the bilateral cervical carotid and vertebral arteries. The carotid
bifurcations appear within normal limits. The right vertebral
appears slightly dominant.

Post-contrast neck MRA images reveal a 3 vessel arch configuration.
No arch or proximal great vessel atherosclerosis or stenosis is
evident.

However, there is an abnormal undulating appearance of the anterior
contour of the left common carotid artery in the neck (series 9,
image 35 and series 105, image 23) which abates just proximal to the
bifurcation. This is associated with abnormal linear decreased
time-of-flight signal in the vessel on series 4, image 83.

The left carotid bifurcation appears normally patent and the
cervical left ICA appears normal. Negative visible left anterior
circulation.

Contralateral right CCA, right carotid bifurcation and cervical
right ICA appear within normal limits. Negative visible right
anterior circulation.

The proximal subclavian arteries and vertebral artery origins appear
patent without stenosis. Tortuous V1 segments. No cervical vertebral
artery irregularity or stenosis. Negative visible posterior
circulation.
IMPRESSION: 1. Abnormal contour of the left common carotid artery in the neck
suspicious for Arterial Dissection.
The left carotid bifurcation and cervical left ICA seem unaffected.
Recommend follow-up CTA neck with IV contrast to confirm.
2. Otherwise negative neck MRA. No cervical right carotid or
vertebral artery abnormality identified.

ADDENDUM:
Study discussed by telephone with Dr. SCHROCK on [DATE] at
[67] hours.

He advises the patient has had previously treated head and neck
cancer. It is possible that the left common carotid artery finding
is susceptibility artifact. CTA should confirm.

*** End of Addendum ***
FINDINGS: Precontrast time-of-flight imaging demonstrates antegrade flow in
the bilateral cervical carotid and vertebral arteries. The carotid
bifurcations appear within normal limits. The right vertebral
appears slightly dominant.

Post-contrast neck MRA images reveal a 3 vessel arch configuration.
No arch or proximal great vessel atherosclerosis or stenosis is
evident.

However, there is an abnormal undulating appearance of the anterior
contour of the left common carotid artery in the neck (series 9,
image 35 and series 105, image 23) which abates just proximal to the
bifurcation. This is associated with abnormal linear decreased
time-of-flight signal in the vessel on series 4, image 83.

The left carotid bifurcation appears normally patent and the
cervical left ICA appears normal. Negative visible left anterior
circulation.

Contralateral right CCA, right carotid bifurcation and cervical
right ICA appear within normal limits. Negative visible right
anterior circulation.

The proximal subclavian arteries and vertebral artery origins appear
patent without stenosis. Tortuous V1 segments. No cervical vertebral
artery irregularity or stenosis. Negative visible posterior
circulation.
IMPRESSION: 1. Abnormal contour of the left common carotid artery in the neck
suspicious for Arterial Dissection.
The left carotid bifurcation and cervical left ICA seem unaffected.
Recommend follow-up CTA neck with IV contrast to confirm.
2. Otherwise negative neck MRA. No cervical right carotid or
vertebral artery abnormality identified.

## 2019-08-22 IMAGING — CT CT ANGIO NECK
2 of 7 series · 8 of 33 positions shown · IV contrast (omnipaque)
Comparison: Head and neck MRA earlier today.

CLINICAL DATA: 68-year-old male with transient abnormal speech
today. Abnormal appearance of the left CCA on neck MRA today.
Reportedly there is a history of treated head and neck cancer.

Normal intracranial MRA today.
EXAM:
CT ANGIOGRAPHY NECK
TECHNIQUE: Multidetector CT imaging of the neck was performed using the
standard protocol during bolus administration of intravenous
contrast. Multiplanar CT image reconstructions and MIPs were
obtained to evaluate the vascular anatomy. Carotid stenosis
measurements (when applicable) are obtained utilizing NASCET
criteria, using the distal internal carotid diameter as the
denominator.
CONTRAST:  100mL OMNIPAQUE IOHEXOL 350 MG/ML SOLN

[Series 4: cta neck · axial · 0.51mm/px · z∈[-124,-42]mm · 2 of 124 slices shown]
[im 42/124  soft-tissue]
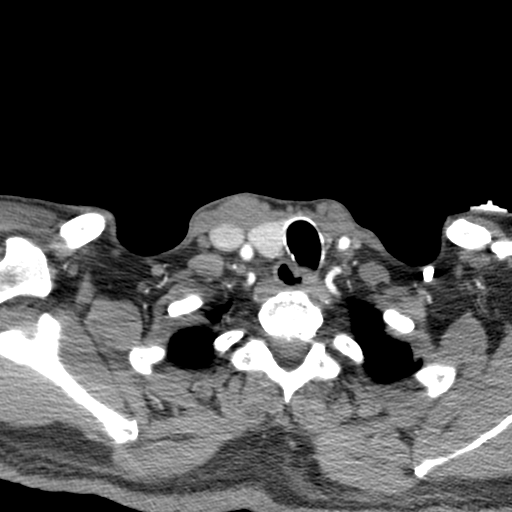
[im 83/124  soft-tissue]
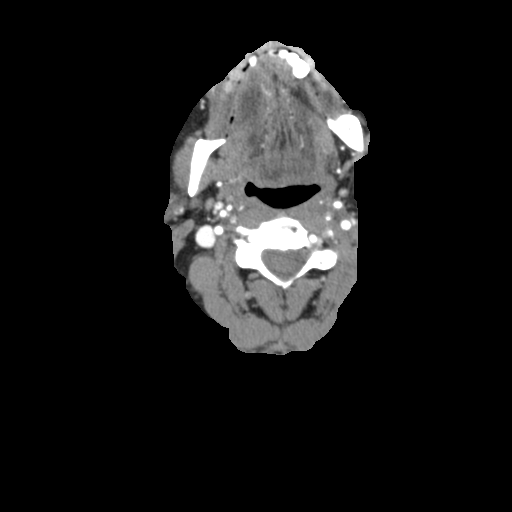

[Series 6: ax thin · axial · 0.39mm/px · z∈[-170,+7]mm · 6 of 247 slices shown]
[im 36/247  soft-tissue]
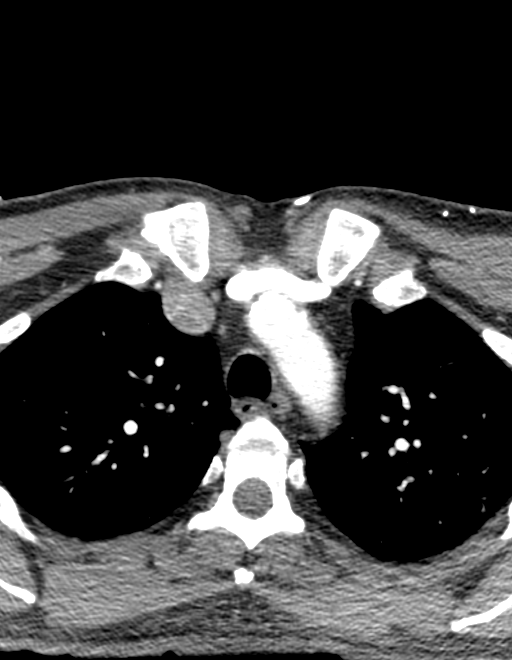
[im 71/247  bone]
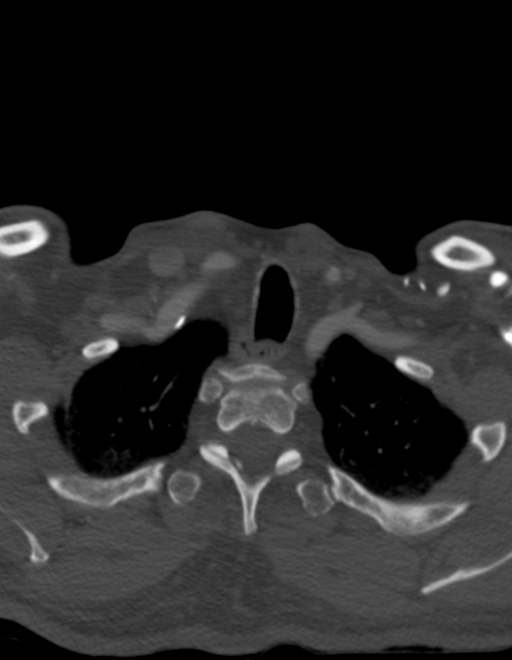
[im 106/247  soft-tissue]
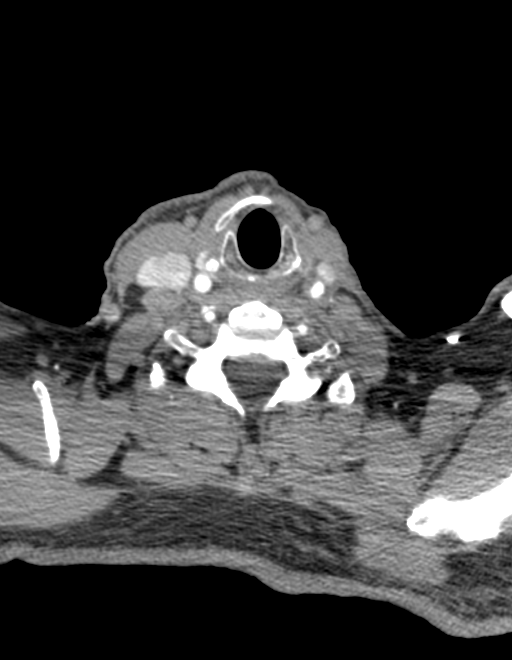
[im 141/247  bone]
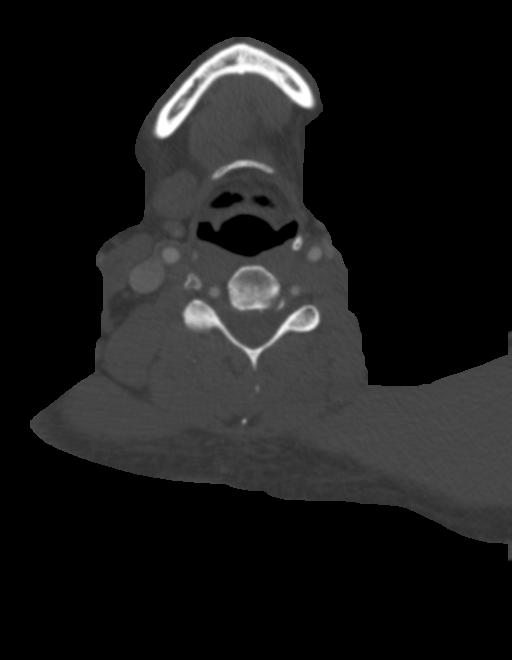
[im 176/247  soft-tissue]
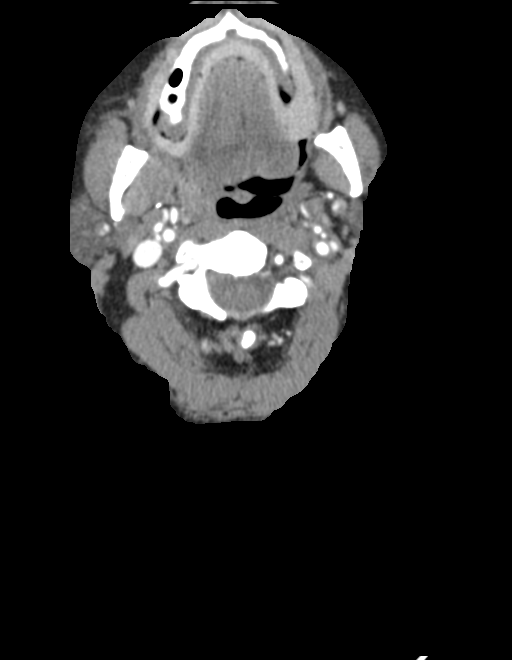
[im 211/247  bone]
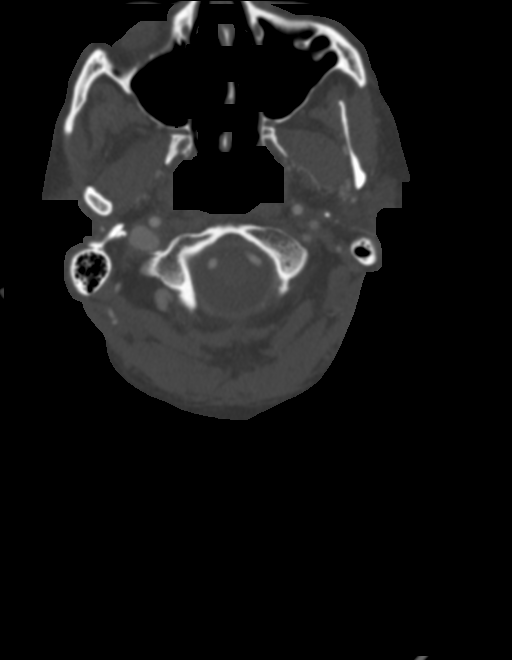

[8 of 33 positions shown; findings below may reference images not displayed]

FINDINGS: Skeleton: Absent maxillary and posterior mandible dentition. Mild
for age cervical spine degeneration. No acute or suspicious osseous
lesion. There is T1 spina bifida occulta, normal variant.

Upper chest: Negative upper lungs. No superior mediastinal
lymphadenopathy.

Other neck: Diminutive or absent left thyroid lobe. No surgical
clips in the neck. But the left submandibular gland is absent.
Laryngeal and pharyngeal soft tissue contours are within normal
limits. Negative parapharyngeal, retropharyngeal, sublingual spaces.
The left parotid gland is partially atrophied. Right submandibular
and parotid glands are within normal limits.

There is an obscuration of some of the left lateral neck soft tissue
planes compatible with prior radiation therapy. The left SCM is
present. The left IJ is present but diminutive.

Aortic arch: 3 vessel arch configuration. Mild for age arch
atherosclerosis.

Right carotid system: Mild brachiocephalic artery soft and calcified
plaque without stenosis. Negative right CCA origin. Minimal soft
plaque in the right CCA proximal to the bifurcation without
stenosis. Mild soft and calcified plaque at the medial right ICA
origin and bulb without stenosis. Visible right ICA siphon is patent
with mild calcified plaque.

Left carotid system: Soft and calcified plaque at the left CCA
origin without stenosis. Bulky and low-density soft plaque in the
medial and ventral left CCA at the level of the thyroid on series 6,
image 52.

Immediately beyond this section the medial and anterior vessel wall
is highly abnormal with a multifocal saccular and undulating
appearance (series 8, image 124) as seen on the MRA today. This is
along a segment of about 3.5 centimeters of the vessel. There are
areas on the axial images which suggest an intimal flap (series 4,
image 57) but otherwise the appearance is not typical of dissection.
Multiple large penetrating ulcers, with contained pseudoaneurysms is
another consideration. The largest discrete lesion would be about 10
millimeters in length by 4-5 millimeters with. Regardless, the
abnormality does result in about 50% stenosis of the left CCA.

The left carotid bifurcation is patent with a more normal
appearance. There is relatively mild and mostly calcified plaque at
the left ICA origin. The left ICA is then patent to the skull base
without additional plaque or abnormality. Visible left ICA siphon is
patent with mild calcified plaque.

Vertebral arteries:
Calcified plaque in the proximal right subclavian artery abutting
the right vertebral artery origin but without associated stenosis
(series 7, image 133). The right vertebral appears mildly dominant
and is patent to the vertebrobasilar junction without stenosis.

Negative visible basilar artery.

Soft and calcified plaque in the proximal left subclavian artery
without stenosis. Normal left vertebral artery origin. Tortuous left
V1 segment. Patent left vertebral artery to the vertebrobasilar
junction without stenosis. Patent PICA origins.

Review of the MIP images confirms the above findings
IMPRESSION: 1. Abnormal left common carotid artery which along a segment of
about 3.5 cm proximal to the bifurcation has an appearance of
multiple saccular lesions which might represent a combination of
dissection, penetrating ulcers, pseudoaneurysms. Immediately
upstream there is bulky low-density plaque within the vessel. And in
the abnormal segment subsequent stenosis of about 50% occurs.
The appearance normalizes proximal to the carotid bifurcation where
there is only mild atherosclerosis and no other left ICA stenosis
through the visible siphon.

2. Fairly mild for age atherosclerosis elsewhere in the neck and at
the visible skull base. Note also MRA of the head and neck was
performed earlier today.

3. Evidence of prior left neck radiation therapy. No neck mass or
lymphadenopathy identified. The left submandibular gland may be
atrophied or could have been resected.

Study discussed by telephone with Dr. SOLAUG in the ED on [DATE]

## 2019-08-22 MED ORDER — ATORVASTATIN CALCIUM 40 MG PO TABS
40.0000 mg | ORAL_TABLET | Freq: Every day | ORAL | Status: DC
  Administered 2019-08-22 – 2019-08-23 (×2): 40 mg via ORAL
  Filled 2019-08-22 (×3): qty 1

## 2019-08-22 MED ORDER — CLOPIDOGREL BISULFATE 75 MG PO TABS
75.0000 mg | ORAL_TABLET | Freq: Every day | ORAL | Status: DC
  Administered 2019-08-22 – 2019-08-23 (×2): 75 mg via ORAL
  Filled 2019-08-22 (×2): qty 1

## 2019-08-22 MED ORDER — SENNOSIDES-DOCUSATE SODIUM 8.6-50 MG PO TABS
1.0000 | ORAL_TABLET | Freq: Every evening | ORAL | Status: DC | PRN
  Filled 2019-08-22: qty 1

## 2019-08-22 MED ORDER — INSULIN ASPART 100 UNIT/ML ~~LOC~~ SOLN
0.0000 [IU] | Freq: Four times a day (QID) | SUBCUTANEOUS | Status: DC
  Administered 2019-08-22: 5 [IU] via SUBCUTANEOUS

## 2019-08-22 MED ORDER — ACETAMINOPHEN 650 MG RE SUPP: 650.0000 mg | RECTAL | Status: DC | PRN

## 2019-08-22 MED ORDER — ENOXAPARIN SODIUM 40 MG/0.4ML ~~LOC~~ SOLN
40.0000 mg | SUBCUTANEOUS | Status: DC
  Administered 2019-08-22: 40 mg via SUBCUTANEOUS
  Filled 2019-08-22: qty 0.4

## 2019-08-22 MED ORDER — AMLODIPINE BESYLATE 5 MG PO TABS
5.0000 mg | ORAL_TABLET | Freq: Every day | ORAL | Status: DC
  Administered 2019-08-22 – 2019-08-23 (×2): 5 mg via ORAL
  Filled 2019-08-22 (×3): qty 1

## 2019-08-22 MED ORDER — IOHEXOL 350 MG/ML SOLN
100.0000 mL | Freq: Once | INTRAVENOUS | Status: AC | PRN
  Administered 2019-08-22: 100 mL via INTRAVENOUS

## 2019-08-22 MED ORDER — GADOBUTROL 1 MMOL/ML IV SOLN
8.0000 mL | Freq: Once | INTRAVENOUS | Status: AC | PRN
  Administered 2019-08-22: 8 mL via INTRAVENOUS

## 2019-08-22 MED ORDER — SODIUM CHLORIDE 0.9 % IV SOLN: INTRAVENOUS | Status: AC

## 2019-08-22 MED ORDER — LEVOTHYROXINE SODIUM 25 MCG PO TABS
25.0000 ug | ORAL_TABLET | Freq: Every day | ORAL | Status: DC
  Administered 2019-08-23: 25 ug via ORAL
  Filled 2019-08-22: qty 1

## 2019-08-22 MED ORDER — ACETAMINOPHEN 325 MG PO TABS: 650.0000 mg | ORAL_TABLET | ORAL | Status: DC | PRN

## 2019-08-22 MED ORDER — ACETAMINOPHEN 160 MG/5ML PO SOLN: 650.0000 mg | ORAL | Status: DC | PRN

## 2019-08-22 MED ORDER — STROKE: EARLY STAGES OF RECOVERY BOOK
Freq: Once | Status: AC
  Filled 2019-08-22 (×2): qty 1

## 2019-08-22 NOTE — ED Notes (Signed)
Report given to [facility]. 

## 2019-08-22 NOTE — H&P (Addendum)
History and Physical    STATHAM STORMONT [id number] DOB: 11/20/1950 DOA: 08/22/2019  PCP: Redmond School, MD   Patient coming from: Home  I have personally briefly reviewed patient's old medical records in [place]  Chief Complaint: Slurred speech  HPI: BRYCETON YANO is a 68 y.o. 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 Patient presented to the ED with complaints of slurred speech that lasted about 3 minutes that occurred morning while he was eating breakfast.  It has not reoccurred.  No facial asymmetry, no vision change, and no weakness of his extremity, no abnormal sensation.  ED Course: Blood pressure elevated systolic Q000111Q to A999333.  CT head negative for acute intracranial abnormality. EDP talked to Dr. Merlene Laughter who recommended MRI MRA brain, if negative, discharged to follow-up as outpatient and switched over to Plavix.  MRI brain was negative for acute infarct, showed a solitary 7 mm area of nonspecific signal abnormality in the left cerebellar peduncle.  Follow-up postcontrast imaging recommended to exclude neoplasm.  MRA head unremarkable.  MRA neck-abnormal consult the left common carotid artery in the neck suspicious for arterial dissection.  CTA neck with contrast recommended-abnormal left common carotid artery appearance of multiple saccular lesions which would represent a combination of dissection, penetrating ulcer, pseudoaneurysm. EDP talked to interventional radiologist recommended admission to [facility] with plans for IR diagnostic angiography tomorrow.  Review of Systems: As per HPI all other systems reviewed and negative.  Past Medical History:  Diagnosis Date  . Cancer [facility]) 2008    Past Surgical History:  Procedure Laterality Date  . HERNIA REPAIR    . TONSILLECTOMY Left    cancer     reports that he quit smoking about 39 years ago. He has never used  smokeless tobacco. He reports previous alcohol use. He reports current drug use.  No Known Allergies  Family History  Problem Relation Age of Onset  . Diabetes Mother   . Heart attack Father     Prior to Admission medications   Medication Sig Start Date End Date Taking? Authorizing Provider  LANTUS SOLOSTAR 100 UNIT/ML Solostar Pen Inject 50-60 Units into the skin at bedtime. 07/30/19  Yes [provider]  levothyroxine (SYNTHROID, LEVOTHROID) 25 MCG tablet Take 25 mcg by mouth daily before breakfast.   Yes [provider]  metFORMIN (GLUCOPHAGE) 500 MG tablet Take 500 mg by mouth 2 (two) times daily with a meal.   Yes [provider]  aspirin 81 MG chewable tablet Chew 81 mg by mouth daily.    [provider]  diazepam (VALIUM) 5 MG tablet Take 5-10 mg by mouth at bedtime as needed. 07/23/19   [provider]    Physical Exam: Vitals:   08/22/19 1800 08/22/19 1800 08/22/19 1815 08/22/19 1830  BP: (!) 172/141 (!) 169/103  (!) 181/79  Pulse: 71  72   Resp: 16 14 13  (!) 23  Temp:      TempSrc:      SpO2: 100% 100% 100%   Weight:      Height:        Constitutional: NAD, calm, comfortable Vitals:   08/22/19 1800 08/22/19 1800 08/22/19 1815 08/22/19 1830  BP: (!) 172/141 (!) 169/103  (!) 181/79  Pulse: 71  72   Resp: 16 14 13  (!) 23  Temp:      TempSrc:      SpO2: 100% 100%  100%   Weight:      Height:       Eyes: PERRL, lids and conjunctivae normal ENMT: Mucous membranes are moist. Posterior pharynx clear of any exudate or lesions. Neck: normal, supple, no masses, no thyromegaly Respiratory: clear to auscultation bilaterally, no wheezing, no crackles. Normal respiratory effort. No accessory muscle use.  Cardiovascular: Regular rate and rhythm,  No extremity edema. 2+ pedal pulses.  Abdomen: no tenderness, no masses palpated. No hepatosplenomegaly. Bowel sounds positive.  Musculoskeletal: no clubbing / cyanosis. No joint  deformity upper and lower extremities. Good ROM, no contractures. Normal muscle tone.  Skin: no rashes, lesions, ulcers. No induration Neurologic: Speech at baseline, no facial asymmetry, normal range of eye movements, strength 5/5 in all extremities, sensation intact globally. Psychiatric: Normal judgment and insight. Alert and oriented x 3. Normal mood.   Labs on Admission: I have personally reviewed following labs and imaging studies  CBC: Recent Labs  Lab 08/22/19 1223  WBC 3.7*  NEUTROABS 2.4  HGB 14.2  HCT 41.5  MCV 94.3  PLT [id number]   Basic Metabolic Panel: Recent Labs  Lab 08/22/19 1223  NA 138  K 4.3  CL 101  CO2 27  GLUCOSE 274*  BUN 12  CREATININE 0.96  CALCIUM 9.3   Liver Function Tests: Recent Labs  Lab 08/22/19 1223  AST 21  ALT 27  ALKPHOS 51  BILITOT 0.6  PROT 7.0  ALBUMIN 4.0   Coagulation Profile: Recent Labs  Lab 08/22/19 1223  INR 1.1   Urine analysis:    Component Value Date/Time   COLORURINE STRAW (A) 08/22/2019 1142   APPEARANCEUR CLEAR 08/22/2019 1142   LABSPEC 1.002 (L) 08/22/2019 1142   PHURINE 6.0 08/22/2019 1142   GLUCOSEU >=500 (A) 08/22/2019 1142   HGBUR NEGATIVE 08/22/2019 1142   BILIRUBINUR NEGATIVE 08/22/2019 1142   KETONESUR NEGATIVE 08/22/2019 1142   PROTEINUR NEGATIVE 08/22/2019 1142   NITRITE NEGATIVE 08/22/2019 1142   LEUKOCYTESUR NEGATIVE 08/22/2019 1142    Radiological Exams on Admission: CT HEAD WO CONTRAST  Result Date: 08/22/2019 CLINICAL DATA:  Speech difficulty. EXAM: CT HEAD WITHOUT CONTRAST TECHNIQUE: Contiguous axial images were obtained from the base of the skull through the vertex without intravenous contrast. COMPARISON:  05/21/2013 FINDINGS: Brain: There is no evidence of acute infarct, intracranial hemorrhage, mass, midline shift, or extra-axial fluid collection. The ventricles and sulci are normal. Bilateral globus pallidus mineralization has slightly progressed. Vascular: Calcified atherosclerosis  at the skull base. No hyperdense vessel. Skull: No fracture or focal osseous lesion. Sinuses/Orbits: Visualized paranasal sinuses and mastoid air cells are clear. Orbits are unremarkable. Other: None. IMPRESSION: No evidence of acute intracranial abnormality. Electronically Signed   By: Logan Bores M.D.   On: 08/22/2019 12:33   CT Angio Neck W and/or Wo Contrast  Result Date: 08/22/2019 CLINICAL DATA:  68 year old male with transient abnormal speech today. Abnormal appearance of the left CCA on neck MRA today. Reportedly there is a history of treated head and neck cancer. Normal intracranial MRA today. EXAM: CT ANGIOGRAPHY NECK TECHNIQUE: Multidetector CT imaging of the neck was performed using the standard protocol during bolus administration of intravenous contrast. Multiplanar CT image reconstructions and MIPs were obtained to evaluate the vascular anatomy. Carotid stenosis measurements (when applicable) are obtained utilizing NASCET criteria, using the distal internal carotid diameter as the denominator. CONTRAST:  183mL OMNIPAQUE IOHEXOL 350 MG/ML SOLN COMPARISON:  Head and neck MRA earlier today. FINDINGS: Skeleton: Absent maxillary and posterior mandible dentition. Mild for  age cervical spine degeneration. No acute or suspicious osseous lesion. There is T1 spina bifida occulta, normal variant. Upper chest: Negative upper lungs. No superior mediastinal lymphadenopathy. Other neck: Diminutive or absent left thyroid lobe. No surgical clips in the neck. But the left submandibular gland is absent. Laryngeal and pharyngeal soft tissue contours are within normal limits. Negative parapharyngeal, retropharyngeal, sublingual spaces. The left parotid gland is partially atrophied. Right submandibular and parotid glands are within normal limits. There is an obscuration of some of the left lateral neck soft tissue planes compatible with prior radiation therapy. The left SCM is present. The left IJ is present but  diminutive. Aortic arch: 3 vessel arch configuration. Mild for age arch atherosclerosis. Right carotid system: Mild brachiocephalic artery soft and calcified plaque without stenosis. Negative right CCA origin. Minimal soft plaque in the right CCA proximal to the bifurcation without stenosis. Mild soft and calcified plaque at the medial right ICA origin and bulb without stenosis. Visible right ICA siphon is patent with mild calcified plaque. Left carotid system: Soft and calcified plaque at the left CCA origin without stenosis. Bulky and low-density soft plaque in the medial and ventral left CCA at the level of the thyroid on series 6, image 52. Immediately beyond this section the medial and anterior vessel wall is highly abnormal with a multifocal saccular and undulating appearance (series 8, image 124) as seen on the MRA today. This is along a segment of about 3.5 centimeters of the vessel. There are areas on the axial images which suggest an intimal flap (series 4, image 57) but otherwise the appearance is not typical of dissection. Multiple large penetrating ulcers, with contained pseudoaneurysms is another consideration. The largest discrete lesion would be about 10 millimeters in length by 4-5 millimeters with. Regardless, the abnormality does result in about 50% stenosis of the left CCA. The left carotid bifurcation is patent with a more normal appearance. There is relatively mild and mostly calcified plaque at the left ICA origin. The left ICA is then patent to the skull base without additional plaque or abnormality. Visible left ICA siphon is patent with mild calcified plaque. Vertebral arteries: Calcified plaque in the proximal right subclavian artery abutting the right vertebral artery origin but without associated stenosis (series 7, image 133). The right vertebral appears mildly dominant and is patent to the vertebrobasilar junction without stenosis. Negative visible basilar artery. Soft and calcified  plaque in the proximal left subclavian artery without stenosis. Normal left vertebral artery origin. Tortuous left V1 segment. Patent left vertebral artery to the vertebrobasilar junction without stenosis. Patent PICA origins. Review of the MIP images confirms the above findings IMPRESSION: 1. Abnormal left common carotid artery which along a segment of about 3.5 cm proximal to the bifurcation has an appearance of multiple saccular lesions which might represent a combination of dissection, penetrating ulcers, pseudoaneurysms. Immediately upstream there is bulky low-density plaque within the vessel. And in the abnormal segment subsequent stenosis of about 50% occurs. The appearance normalizes proximal to the carotid bifurcation where there is only mild atherosclerosis and no other left ICA stenosis through the visible siphon. 2. Fairly mild for age atherosclerosis elsewhere in the neck and at the visible skull base. Note also MRA of the head and neck was performed earlier today. 3. Evidence of prior left neck radiation therapy. No neck mass or lymphadenopathy identified. The left submandibular gland may be atrophied or could have been resected. Study discussed by telephone with Dr. Stark Jock in the ED on  08/22/2019 at 16:20 . Electronically Signed   By: Genevie Ann M.D.   On: 08/22/2019 16:22   MR ANGIO HEAD WO CONTRAST  Result Date: 08/22/2019 CLINICAL DATA:  68 year old male with abnormal speech, stroke symptoms. EXAM: MRA HEAD WITHOUT CONTRAST TECHNIQUE: Angiographic images of the Circle of Willis were obtained using MRA technique without intravenous contrast. COMPARISON:  Brain MRI and head CT earlier today. FINDINGS: Antegrade flow in the posterior circulation with mildly dominant distal right vertebral artery. No distal vertebral or vertebrobasilar junction stenosis. Patent PICA origins. Patent basilar artery without stenosis. Normal SCA and PCA origins. Posterior communicating arteries are diminutive or absent.  Bilateral PCA branches are within normal limits. Antegrade flow in both ICA siphons. No siphon stenosis. Ectasia of the right ophthalmic artery origin is suspected. No ICA siphon aneurysm. Patent carotid termini. Normal MCA and ACA origins. Anterior communicating artery diminutive or absent. Visible ACA branches are within normal limits. Bilateral MCA M1 segments and MCA bi/trifurcations are patent without stenosis. Visible MCA branches are within normal limits. IMPRESSION: Negative intracranial MRA. Electronically Signed   By: Genevie Ann M.D.   On: 08/22/2019 14:48   MR Angiogram Neck W or Wo Contrast  Addendum Date: 08/22/2019   ADDENDUM REPORT: 08/22/2019 15:20 ADDENDUM: Study discussed by telephone with Dr. Noemi Chapel on 08/22/2019 at 1457 hours. He advises the patient has had previously treated head and neck cancer. It is possible that the left common carotid artery finding is susceptibility artifact. CTA should confirm. Electronically Signed   By: Genevie Ann M.D.   On: 08/22/2019 15:20   Result Date: 08/22/2019 CLINICAL DATA:  68 year old male with abnormal speech. Stroke-like symptoms. EXAM: MRA NECK WITHOUT AND WITH CONTRAST TECHNIQUE: Multiplanar and multiecho pulse sequences of the neck were obtained without and with intravenous contrast. Angiographic images of the neck were obtained using MRA technique without and with intravenous contrast. CONTRAST:  15mL GADAVIST GADOBUTROL 1 MMOL/ML IV SOLN COMPARISON:  Brain MRI and intracranial MRA today reported separately. FINDINGS: Precontrast time-of-flight imaging demonstrates antegrade flow in the bilateral cervical carotid and vertebral arteries. The carotid bifurcations appear within normal limits. The right vertebral appears slightly dominant. Post-contrast neck MRA images reveal a 3 vessel arch configuration. No arch or proximal great vessel atherosclerosis or stenosis is evident. However, there is an abnormal undulating appearance of the anterior  contour of the left common carotid artery in the neck (series 9, image 35 and series 105, image 23) which abates just proximal to the bifurcation. This is associated with abnormal linear decreased time-of-flight signal in the vessel on series 4, image 83. The left carotid bifurcation appears normally patent and the cervical left ICA appears normal. Negative visible left anterior circulation. Contralateral right CCA, right carotid bifurcation and cervical right ICA appear within normal limits. Negative visible right anterior circulation. The proximal subclavian arteries and vertebral artery origins appear patent without stenosis. Tortuous V1 segments. No cervical vertebral artery irregularity or stenosis. Negative visible posterior circulation. IMPRESSION: 1. 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 The left carotid bifurcation and cervical left ICA seem unaffected. Recommend follow-up CTA neck with IV contrast to confirm. 2. Otherwise negative neck MRA. No cervical right carotid or vertebral artery abnormality identified. Electronically Signed: By: Genevie Ann M.D. On: 08/22/2019 14:53   MR BRAIN WO CONTRAST  Result Date: 08/22/2019 CLINICAL DATA:  68 year old male with abnormal speech, stroke symptoms. EXAM: MRI HEAD WITHOUT CONTRAST TECHNIQUE: Multiplanar, multiecho pulse  sequences of the brain and surrounding structures were obtained without intravenous contrast. COMPARISON:  Head CT earlier today. FINDINGS: Brain: No restricted diffusion or evidence of acute infarction. No midline shift, mass effect, evidence of mass lesion, ventriculomegaly, extra-axial collection or acute intracranial hemorrhage. Cervicomedullary junction and pituitary are within normal limits. A small intraventricular adhesion is suspected near the right frontal horn (series 7, image 15, normal variant). There is abnormal T2 and FLAIR hyperintensity in the left cerebellar peduncle on  series 7 image 7 where diffusion appears facilitated (series 4, image 14). The abnormality is about 6-7 millimeters. The trace diffusion image does appear mildly abnormal here. T1 signal is also mildly decreased. No associated hemorrhage. Elsewhere gray and white matter signal appears within normal limits for age, including in the brainstem. No cortical encephalomalacia or chronic cerebral blood products identified. Cerebral volume appears within normal limits for age. Vascular: Major intracranial vascular flow voids are preserved. Skull and upper cervical spine: Negative visible cervical spine. Visualized bone marrow signal is within normal limits. Sinuses/Orbits: Negative orbits. Paranasal sinuses and mastoids are stable and well pneumatized. Other: Visible internal auditory structures appear normal. Scalp and face soft tissues appear negative. IMPRESSION: No acute infarct identified, but there is a solitary 7 mm area of nonspecific signal abnormality in the left cerebellar peduncle. Elsewhere the brain appears within normal limits for age. Follow-up post-contrast imaging is recommended to exclude neoplasm. But otherwise this resembles a nonspecific area of gliosis, with no associated mass effect, hemosiderin or mineralization. No associated brainstem or cerebellar abnormality. Electronically Signed   By: Genevie Ann M.D.   On: 08/22/2019 14:46    EKG: Independently reviewed.  Sinus rhythm.  QTc 439.  EKG, T wave changes to lead aVL only.  Assessment/Plan Active Problems:   TIA (transient ischemic attack)   TIA- transient slurred speech lasted about 3 minutes, now at baseline.  No other focal deficits.  Neurology exam unremarkable.  MRI negative for acute infarct, 7 mm solitary area of nonspecific signal abnormality in the left cerebellar peduncle, post contrast imaging recommended.  Patient is on aspirin he takes 2-3 times/week. -Will start Plavix -MRA and CTA neck done, deferred carotid Dopplers -Lipid  panel, HgbA1c in a.m. -Echocardiogram -PT, speech therapy evaluation -Start atorvastatin at 40 mg daily -Talked to neurology, Dr. Cheral Marker, patient does not need inpatient evaluation by neurology, as his symptoms have resolved.    Abnormal left common carotid artery- as seen on MRI neck and subsequent CTA recommended- 3.5 cm proximal to the bifurcation has an appearance of multiple saccular lesions which might represent a combination of dissection, penetrating ulcers, pseudoaneurysms.  - EDP talked to interventional radiology Dr. Estanislado Pandy- recommended admission to [facility]. -Request placed for IR carotid angiography - Please contact Dr. Patrecia Pour tomorrow morning, he requests rounding provider call in the morning to help arrange getting diagnostic IR carotid angiography done. -IR okay with antiplatelet-Plavix and pharmacologic DVT prophylaxis. - N.p.o. midnight -Will give IV fluids for contrast exposure, N/s 100cc/hr x 15 hrs.  History of left tonsillar squamous cell carcinoma-status post surgery and radiation therapy 2009.  Follows with ortholaryngology at [facility].  Diabetes mellitus- random glucose 274 - SSI -Hold home Lantus while NPO. -Hold home Metformin with contrast exposure.  Hypertension -persistently elevated systolic Q000111Q to A999333.  Not on antihypertensives -Will start Norvasc 5 mg daily   DVT prophylaxis: Lovenox Code Status: Full code Family Communication: None at bedside Disposition Plan: Per rounding team Consults called: Neurology, interventional radiology Admission status:  Obseveration, telemetry   Bethena Roys MD [facility]  08/22/2019, 10:30 PM

## 2019-08-22 NOTE — Plan of Care (Signed)
  Problem: Education: Goal: Knowledge of secondary prevention will improve Outcome: Progressing Goal: Knowledge of patient specific risk factors addressed and post discharge goals established will improve Outcome: Progressing   Problem: Coping: Goal: Will identify appropriate support needs Outcome: Progressing   Problem: Nutrition: Goal: Risk of aspiration will decrease Outcome: Progressing

## 2019-08-22 NOTE — ED Notes (Signed)
Pt desires his wallet to be locked up by security until his wife can return to pick it up   Security called and they will pick up and sercure until wife comes  Dr E at bedside

## 2019-08-22 NOTE — ED Notes (Signed)
To Rad 

## 2019-08-22 NOTE — ED Provider Notes (Signed)
Care assumed from Dr. Sabra Heck at shift change.    Patient is a 68 year old male with past medical history of diabetes and carcinoma of the left tonsil status post surgical excision with lymph node removal, chemotherapy, and radiation.  This was performed at [facility].  He presents here today with complaints of dysphasia that occurred after eating breakfast.  Patient reports a 10-minute episode where his tongue would not work and his speech was slurred making it difficult for him to speak.  This resolved after approximately 10 minutes.  Care signed out to me awaiting results of a CT angiogram of the neck to further evaluate what appears to be luminal irregularities to the left carotid artery.  This study has resulted and shows a 3.5 cm length of the carotid artery proximal to the bifurcation with multiple abnormalities.  Whether these abnormalities represent a dissection or scarring from prior radiation are unknown.  I discussed this with Dr. Estanislado Pandy from interventional radiology.  He feels as though the patient will require a diagnostic angiogram and states that this can be performed tomorrow at [facility].  He is recommending admission to the hospitalist service.  I have spoken with Dr. Denton Brick who agrees to admit.   Veryl Speak, MD 08/22/19 1840

## 2019-08-22 NOTE — ED Provider Notes (Signed)
[facility] Provider Note   CSN: [id number] Arrival date & time: 08/22/19  1115     History Chief Complaint  Patient presents with  . Aphasia    Resolved    Samuel Barr is a 68 y.o. male.  HPI   This patient is a very pleasant 69 year old male, he has a known history of head and neck cancer as well as diabetes.  He reports that he has been on his medications as per his usual and was in his usual state of health until this morning when he had finished eating his cereal when he noticed that he was having difficulty talking stating that his tongue was immobilized like it would not work or move.  He felt like he was trying to talk with somebody holding his tongue.  This lasted somewhere between 5 and 10 minutes, he was able to communicate to his wife that something was wrong and they brought him to the hospital for further evaluation.  This initially occurred a couple of hours ago, since it had resolved it has not returned.  He denies any changes in vision, coordination, balance, gait and denies any weakness or numbness of his arms or legs.  He states that occasionally has some numbness on his lower lip.  Primarily the patient is concerned because of his history of cancer of the tonsil, he underwent multiple radiation treatments in the past however that was over 12 years ago and he has never had any symptoms in his tongue like this.  Currently the patient is asymptomatic and specifically denies chest pain, headache, blurred vision, double vision, chest pain, cough, shortness of breath, nausea vomiting or diarrhea or abdominal pain or swelling.  Past Medical History:  Diagnosis Date  . Cancer [facility]) 2008    There are no problems to display for this patient.   Past Surgical History:  Procedure Laterality Date  . HERNIA REPAIR    . TONSILLECTOMY Left    cancer       Family History  Problem Relation Age of Onset  . Diabetes Mother   . Heart attack Father      Social History   Tobacco Use  . Smoking status: Former Smoker    Quit date: 09/08/1979    Years since quitting: 39.9  . Smokeless tobacco: Never Used  Substance Use Topics  . Alcohol use: Not Currently    Comment: occasional  . Drug use: Yes    Comment: occas    Home Medications Prior to Admission medications   Medication Sig Start Date End Date Taking? Authorizing Provider  aspirin 81 MG chewable tablet Chew 81 mg by mouth daily.    [provider]  levothyroxine (SYNTHROID, LEVOTHROID) 25 MCG tablet Take 25 mcg by mouth daily before breakfast.    [provider]  metFORMIN (GLUCOPHAGE) 500 MG tablet Take 500 mg by mouth 2 (two) times daily with a meal.    [provider]  Omega-3 Fatty Acids (FISH OIL) 500 MG CAPS Take 350 mg by mouth daily.    [provider]    Allergies    Patient has no known allergies.  Review of Systems   Review of Systems  All other systems reviewed and are negative.   Physical Exam Updated Vital Signs BP (!) 162/78 (BP Location: Right Arm)   Pulse 69   Temp 98.2 F (36.8 C) (Oral)   Resp 20   Ht 1.778 m (5\' 10" )   Wt 82.1  kg   SpO2 100%   BMI 25.97 kg/m   Physical Exam Vitals and nursing note reviewed.  Constitutional:      General: He is not in acute distress.    Appearance: He is well-developed.  HENT:     Head: Normocephalic and atraumatic.     Mouth/Throat:     Pharynx: No oropharyngeal exudate.  Eyes:     General: No scleral icterus.       Right eye: No discharge.        Left eye: No discharge.     Conjunctiva/sclera: Conjunctivae normal.     Pupils: Pupils are equal, round, and reactive to light.  Neck:     Thyroid: No thyromegaly.     Vascular: No JVD.  Cardiovascular:     Rate and Rhythm: Normal rate and regular rhythm.     Heart sounds: Normal heart sounds. No murmur. No friction rub. No gallop.   Pulmonary:     Effort: Pulmonary effort is normal. No respiratory distress.      Breath sounds: Normal breath sounds. No wheezing or rales.  Abdominal:     General: Bowel sounds are normal. There is no distension.     Palpations: Abdomen is soft. There is no mass.     Tenderness: There is no abdominal tenderness.  Musculoskeletal:        General: No tenderness. Normal range of motion.     Cervical back: Normal range of motion and neck supple.  Lymphadenopathy:     Cervical: No cervical adenopathy.  Skin:    General: Skin is warm and dry.     Findings: No erythema or rash.  Neurological:     Mental Status: He is alert.     Coordination: Coordination normal.     Comments: Speech is clear, cranial nerves III through XII are intact, memory is intact, strength is normal in all 4 extremities including grips, sensation is intact to light touch and pinprick in all 4 extremities. Coordination as tested by finger-nose-finger is normal, no limb ataxia. Normal gait, normal reflexes at the patellar tendons bilaterally  Psychiatric:        Behavior: Behavior normal.     ED Results / Procedures / Treatments   Labs (all labs ordered are listed, but only abnormal results are displayed) Labs Reviewed  ETHANOL  PROTIME-INR  APTT  CBC  DIFFERENTIAL  COMPREHENSIVE METABOLIC PANEL  RAPID URINE DRUG SCREEN, HOSP PERFORMED  URINALYSIS, ROUTINE W REFLEX MICROSCOPIC    EKG None  Radiology No results found.  Procedures Procedures (including critical care time)  Medications Ordered in ED Medications - No data to display  ED Course  I have reviewed the triage vital signs and the nursing notes.  Pertinent labs & imaging results that were available during my care of the patient were reviewed by me and considered in my medical decision making (see chart for details).    MDM Rules/Calculators/A&P                      The patient appears well, he is not in any distress, he has had symptoms that could be related to a possible transient ischemic attack.  His neurologic exam  is reassuring, blood pressure is 162/78 with no fever tachycardia or hypoxia.  He has no other symptoms to suggest another cause.  We will need to have evaluation with CT scan and consultation with neurology.  I discussed the case with the neurologist Dr. Merlene Laughter,  he recommends MRI MRA of the brain.  This can be done in the emergency department, if it is positive he will need to be admitted, if it is negative he can be discharged home to follow-up in the outpatient setting and switched over to Plavix.  At change of shift, MRI showed some abnormalities of the left carotid artery that required CT angiogram, this was ordered however at change of shift care was signed out to Dr. Trish Mage to follow-up results and disposition accordingly   Final Clinical Impression(s) / ED Diagnoses Final diagnoses:  None    Rx / DC Orders ED Discharge Orders    None       Noemi Chapel, MD 08/23/19 0730

## 2019-08-22 NOTE — ED Notes (Signed)
Meal provided as pt has stated he has a cooked ham at home and is hungry   He is awaiting hospitalist  He asks this writer if he is to be kept overnight why he cannot go home and have test scheduled

## 2019-08-22 NOTE — Progress Notes (Signed)
Pt arrived from previous facility aox3 amb. 0 c/o pain or distress. Per report pt took his Metformin prior to coming, educated on not taking post contrast x 48 hours

## 2019-08-22 NOTE — ED Notes (Signed)
Pt ate 75 per cent of meal

## 2019-08-22 NOTE — ED Notes (Signed)
Dr D at bedside

## 2019-08-22 NOTE — ED Triage Notes (Signed)
Patient reports having problems with his tongue and jaw, had difficulty speaking, slurred speech, "lasting about 4-5 minutes." Patient denies headache or dizziness, denies weakness. Patient states his mouth did not feel like it was numb.

## 2019-08-22 NOTE — Discharge Instructions (Addendum)
Moderate Conscious Sedation, Adult, Care After These instructions provide you with information about caring for yourself after your procedure. Your health care provider may also give you more specific instructions. Your treatment has been planned according to current medical practices, but problems sometimes occur. Call your health care provider if you have any problems or questions after your procedure. What can I expect after the procedure? After your procedure, it is common:  To feel sleepy for several hours.  To feel clumsy and have poor balance for several hours.  To have poor judgment for several hours.  To vomit if you eat too soon. Follow these instructions at home: For at least 24 hours after the procedure:   Do not: ? Participate in activities where you could fall or become injured. ? Drive. ? Use heavy machinery. ? Drink alcohol. ? Take sleeping pills or medicines that cause drowsiness. ? Make important decisions or sign legal documents. ? Take care of children on your own.  Rest. Eating and drinking  Follow the diet recommended by your health care provider.  If you vomit: ? Drink water, juice, or soup when you can drink without vomiting. ? Make sure you have little or no nausea before eating solid foods. General instructions  Have a responsible adult stay with you until you are awake and alert.  Take over-the-counter and prescription medicines only as told by your health care provider.  If you smoke, do not smoke without supervision.  Keep all follow-up visits as told by your health care provider. This is important. Contact a health care provider if:  You keep feeling nauseous or you keep vomiting.  You feel light-headed.  You develop a rash.  You have a fever. Get help right away if:  You have trouble breathing. This information is not intended to replace advice given to you by your health care provider. Make sure you discuss any questions you have  with your health care provider. Document Released: 06/06/2013 Document Revised: 07/29/2017 Document Reviewed: 12/06/2015 Elsevier Patient Education  [place]. Cerebral Angiogram, Care After This sheet gives you information about how to care for yourself after your procedure. Your health care provider may also give you more specific instructions. If you have problems or questions, contact your health care provider. What can I expect after the procedure? After your procedure, it is common to have:  Bruising and tenderness at the catheter insertion site.  A mild headache. Follow these instructions at home: Insertion site care   Follow instructions from your health care provider about how to take care of the insertion site. Make sure you: ? Wash your hands with soap and water before you change your bandage (dressing). If soap and water are not available, use hand sanitizer. ? Change your dressing as told by your health care provider. ? Leave stitches (sutures), skin glue, or adhesive strips in place. These skin closures may need to stay in place for 2 weeks or longer. If adhesive strip edges start to loosen and curl up, you may trim the loose edges. Do not remove adhesive strips completely unless your health care provider tells you to do that.  Do not apply powder or lotion to the site.  Do not take baths, swim, or use a hot tub until your health care provider says it is okay to do so.  You may shower 24-48 hours after the procedure or as told by your health care provider. ? Gently wash the site with plain soap and water. ?  Pat the area dry with a clean towel. ? Do not rub the site. This may cause bleeding.  Check your incision area every day for signs of infection. Check for: ? Redness, swelling, or pain. ? Fluid or blood. ? Warmth. ? Pus or a bad smell. Activity  Rest as told by your health care provider.  Do not lift anything that is heavier than 10 lb (4.5 kg), or the  limit that your health care provider tells you, until he or she says that it is safe.  Do not drive for 24 hours if you were given a medicine to help you relax (sedative).  Do not drive or use heavy machinery while taking prescription pain medicine. General instructions   Return to your normal activities as told by your health care provider. Ask your health care provider what activities are safe for you.  If the catheter site starts to bleed, lie flat and put pressure on the site.  Drink enough fluid to keep your urine clear or pale yellow. This helps flush the contrast dye from your body.  Take over-the-counter and prescription medicines only as told by your health care provider.  Keep all follow-up visits as directed by your health care provider. This is important. Contact a health care provider if:  You have a fever or chills.  You have redness, swelling, or more pain around your insertion site.  You have fluid or blood coming from your insertion site.  The insertion site feels warm to the touch.  You have pus or a bad smell coming from your insertion site.  You have more bruising around the insertion site.  Blood collects in the tissue around the catheter site (hematoma), and the hematoma may be painful to the touch. Get help right away if:  You have vision changes or loss of vision.  The catheter insertion area swells very fast.  You have numbness or weakness on one side of your body.  You have trouble talking, or you have slurred speech or cannot speak (aphasia).  You feel confused or have trouble remembering.  You have severe pain at the catheter insertion area.  The catheter insertion area is bleeding, and the bleeding does not stop after 30 minutes of holding steady pressure on the site.  The arm or leg where the catheter was inserted is numb, tingling, or cold.  You have chest pain.  You have trouble breathing.  You have a rash.  You have trouble  using the arm or leg where the catheter was inserted. These symptoms may represent a serious problem that is an emergency. Do not wait to see if the symptoms will go away. Get medical help right away. Call your local emergency services (911 in [place] Do not drive yourself to the hospital. Summary  After your procedure, it is common to have bruising and tenderness at the site where the catheter was inserted.  If your catheter insertion site begins to bleed, put direct pressure on it until the bleeding stops.  After your procedure, it is important to rest and drink plenty of fluids.  Return to your normal activities as told by your health care provider. Ask your health care provider what activities are safe for you. This information is not intended to replace advice given to you by your health care provider. Make sure you discuss any questions you have with your health care provider. Document Released: 12/31/2013 Document Revised: 07/29/2017 Document Reviewed: 09/20/2016 Elsevier Patient Education  2020 [employer].  Cerebral Angiogram  A cerebral angiogram is a procedure that is used to examine the blood vessels in the brain and neck. In this procedure, contrast dye is injected through a long, thin tube (catheter) into an artery. X-rays are then taken, which show if there is a blockage or problem in a blood vessel. Tell a health care provider about:  Any allergies you have, including allergies to shellfish, contrast dye, or iodine  All medicines you are taking, including vitamins, herbs, eye drops, creams, and over-the-counter medicines.  Any problems you or family members have had with anesthetic medicines.  Any blood disorders you have.  Any surgeries you have had.  Any medical conditions you have.  Whether you are pregnant or may be pregnant.  Whether you are currently breastfeeding. What are the risks? Generally, this is a safe procedure. However, problems may occur,  including:  Damage to surrounding nerves, tissues, or structures.  Blood clot.  Inability to remember what happened (amnesia). This is usually temporary.  Weakness, numbness, speech, or vision problems. This is usually temporary.  Stroke.  Kidney injury.  Bleeding or bruising.  Allergic reaction medicines or dyes.  Infection. What happens before the procedure? Staying hydrated Follow instructions from your health care provider about hydration, which may include:  Up to 2 hours before the procedure - you may continue to drink clear liquids, such as water, clear fruit juice, black coffee, and plain tea. Eating and drinking restrictions Follow instructions from your health care provider about eating and drinking, which may include:  8 hours before the procedure - stop eating heavy meals or foods such as meat, fried foods, or fatty foods.  6 hours before the procedure - stop eating light meals or foods, such as toast or cereal.  6 hours before the procedure - stop drinking milk or drinks that contain milk.  2 hours before the procedure - stop drinking clear liquids. General instructions  Ask your health care provider about: ? Changing or stopping your regular medicines. This is especially important if you are taking diabetes medicines or blood thinners. ? Taking medicines such as aspirin or ibuprofen. These medicines can thin your blood. Do not take these medicines before your procedure if your health care provider asks you not to.  You may have blood tests done.  Plan to have someone take you home from the hospital or clinic.  If you will be going home right after the procedure, plan to have someone with you for 24 hours. What happens during the procedure?  To reduce your risk of infection: ? Your health care team will wash or sanitize their hands. ? Your skin will be washed with soap. ? Hair may be removed from the surgical area.  You will lie on your back on an  imaging bed with an X-ray machine around you.  Your head will be secured to the bed with a strap or device to help you keep still.  An IV tube will be inserted into one of your veins.  You will be given one or more of the following: ? A medicine to help you relax (sedative). ? A medicine to numb the area (local anesthetic) where the catheter will be inserted. This is usually your groin, leg, or arm.  Your heart rate and other vital signs will be watched carefully. You may have electrodes placed on your chest.  A small cut (incision) will be made where the catheter will be inserted.  The catheter will be inserted  into an artery that leads to the head. You may feel slight pressure.  The catheter will be moved through the body up to your neck and brain. X-ray images will help your health care provider bring the catheter to the correct location.  The dye will be injected into the catheter and will travel to your head or neck area. You may feel a warming or burning sensation or notice a strange taste in your mouth as the dye goes through your system.  Images will be taken to show how the dye flows through the area.  While the images are being taken, you may be given instructions on breathing, swallowing, moving, or talking.  When the images are finished, the catheter will be slowly removed.  Pressure will be applied to the skin to stop any bleeding. A tight bandage (dressing) or seal will be applied to the skin.  Your IV will be removed. The procedure may vary among health care providers and hospitals. What happens after the procedure?  Your blood pressure, heart rate, breathing rate, and blood oxygen level will be monitored until the medicines you were given have worn off.  You will be asked to lie flat for several hours. The arm or leg where the catheter was inserted will need to be kept straight while you are in the recovery room.  The insertion site will be watched for bleeding and  you will be checked often.  You will be instructed to drink plenty of fluids. This will help wash the contrast dye out of your system.  Do not drive for 24 hours if you received a sedative.  It is up to you to get the results of your procedure. Ask your health care provider, or the department that is doing the procedure, when your results will be ready. Summary  A cerebral angiogram is a procedure that is used to examine the blood vessels in the brain and neck.  In this procedure, contrast dye is injected through a long, thin tube (catheter) into an artery. X-rays are then taken, which show if there is a blockage or problem in a blood vessel.  You will be given a sedative to help you relax during the procedure. A local anesthetic will be used to numb the area where the catheter is inserted. You may feel pressure when the catheter is inserted, and you may feel a warm sensation when the dye is injected.  After the procedure, you will be asked to lie flat for several hours. The arm or leg where the catheter was inserted will need to be kept straight while you are in the recovery room. This information is not intended to replace advice given to you by your health care provider. Make sure you discuss any questions you have with your health care provider. Document Released: 12/31/2013 Document Revised: 07/29/2017 Document Reviewed: 09/20/2016 Elsevier Patient Education  2020 [employer]. Your testing shows no signs of stroke,  Your MRI shows a subtle abnormality on the back of your brain - call your doctor on Monday morning to schedule a follow up to discuss these results - otherwise, your studies are reassuring  You should return to the emergency department should you develop severe or worsening symptoms including weakness, numbness, difficulty speaking, difficulty with vision or speech.

## 2019-08-23 ENCOUNTER — Observation Stay (HOSPITAL_COMMUNITY): Payer: Medicare HMO

## 2019-08-23 ENCOUNTER — Observation Stay (HOSPITAL_BASED_OUTPATIENT_CLINIC_OR_DEPARTMENT_OTHER): Payer: Medicare HMO

## 2019-08-23 ENCOUNTER — Encounter (HOSPITAL_COMMUNITY): Payer: Self-pay | Admitting: Internal Medicine

## 2019-08-23 DIAGNOSIS — I6522 Occlusion and stenosis of left carotid artery: Secondary | ICD-10-CM | POA: Diagnosis not present

## 2019-08-23 DIAGNOSIS — G459 Transient cerebral ischemic attack, unspecified: Secondary | ICD-10-CM | POA: Diagnosis not present

## 2019-08-23 DIAGNOSIS — I6529 Occlusion and stenosis of unspecified carotid artery: Secondary | ICD-10-CM

## 2019-08-23 DIAGNOSIS — I671 Cerebral aneurysm, nonruptured: Secondary | ICD-10-CM | POA: Diagnosis not present

## 2019-08-23 HISTORY — PX: IR ANGIO VERTEBRAL SEL VERTEBRAL BILAT MOD SED: IMG5369

## 2019-08-23 HISTORY — PX: IR ANGIO INTRA EXTRACRAN SEL COM CAROTID INNOMINATE BILAT MOD SED: IMG5360

## 2019-08-23 LAB — GLUCOSE, CAPILLARY
Glucose-Capillary: 131 mg/dL — ABNORMAL HIGH (ref 70–99)
Glucose-Capillary: 149 mg/dL — ABNORMAL HIGH (ref 70–99)
Glucose-Capillary: 180 mg/dL — ABNORMAL HIGH (ref 70–99)
Glucose-Capillary: 237 mg/dL — ABNORMAL HIGH (ref 70–99)
Glucose-Capillary: 239 mg/dL — ABNORMAL HIGH (ref 70–99)

## 2019-08-23 LAB — ECHOCARDIOGRAM COMPLETE
Height: 70 in
Weight: 2896 oz

## 2019-08-23 LAB — HIV ANTIBODY (ROUTINE TESTING W REFLEX): HIV Screen 4th Generation wRfx: NONREACTIVE

## 2019-08-23 IMAGING — XA IR ANGIO INTRA EXTRACRAN SEL COM CAROTID INNOMINATE BILAT MOD SE
1 of 2 series · 12 of 24 positions shown · IV contrast (IODINE)
Comparison: CT scan of the head and neck [DATE].

CLINICAL DATA: History of left facial and mouth tingling. Workup
with CT angiogram of the head and neck revealed abnormalities of the
left common carotid artery proximally.

EXAM:
EXAM
BILATERAL COMMON CAROTID AND INNOMINATE ANGIOGRAPHY
TECHNIQUE: Informed written consent was obtained from the patient after a
thorough discussion of the procedural risks, benefits and
alternatives. All questions were addressed. Maximal Sterile Barrier
Technique was utilized including caps, mask, sterile gowns, sterile
gloves, sterile drape, hand hygiene and skin antiseptic. A timeout
was performed prior to the initiation of the procedure.

[Series 300: dr. (person_name) · 12 of 343 slices shown]
[im 1/343]
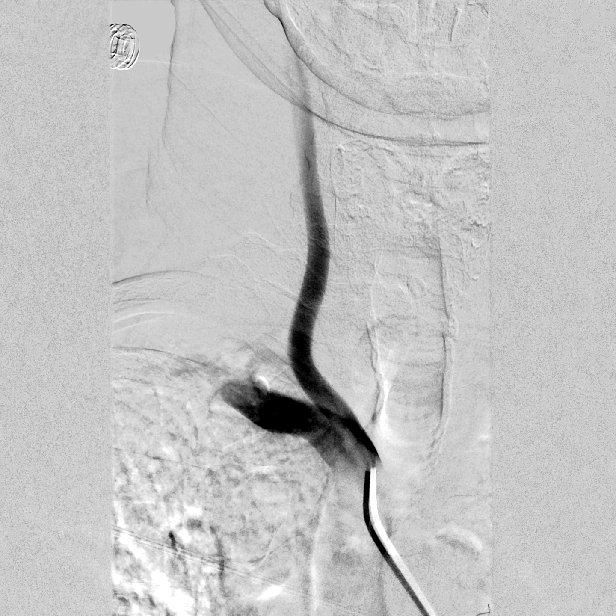
[im 32/343]
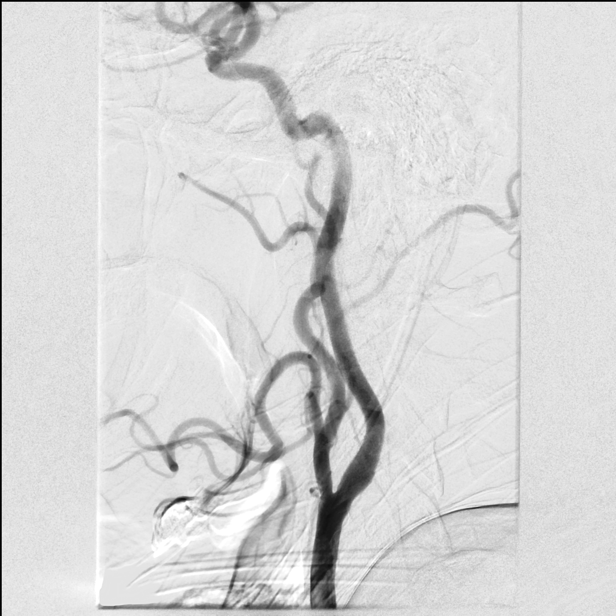
[im 63/343]
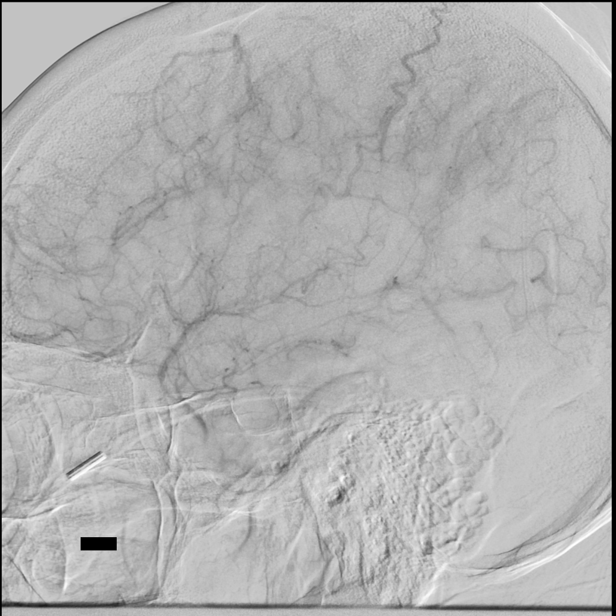
[im 94/343]
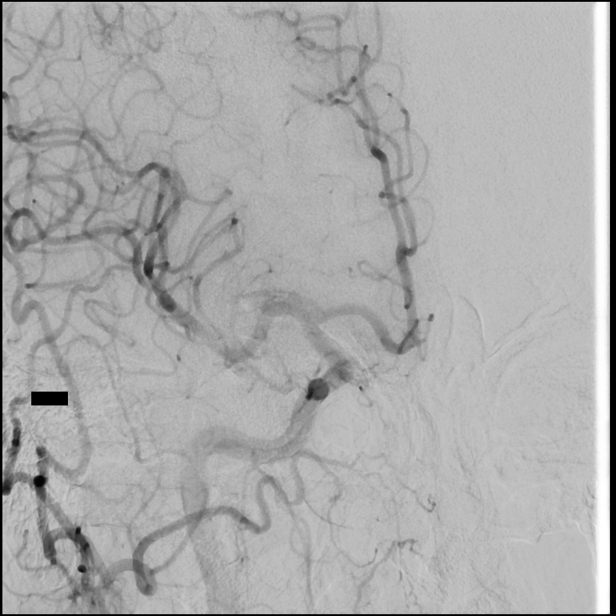
[im 125/343]
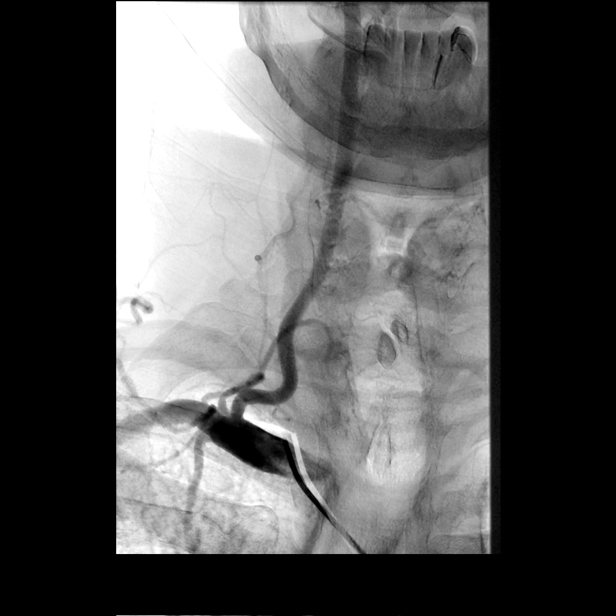
[im 156/343]
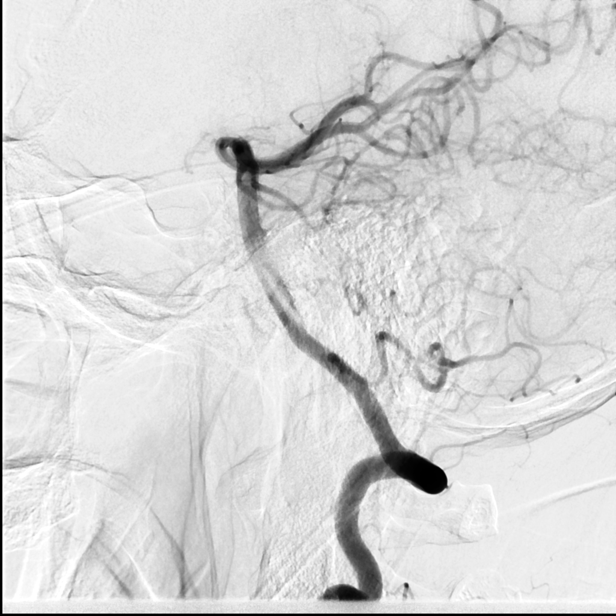
[im 187/343]
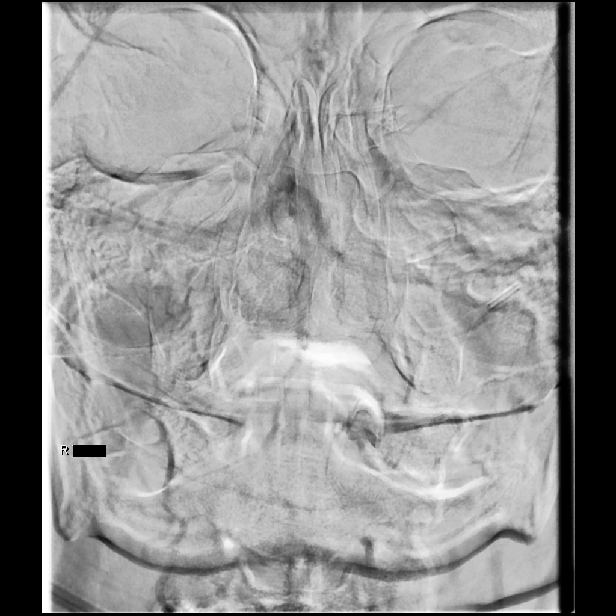
[im 218/343]
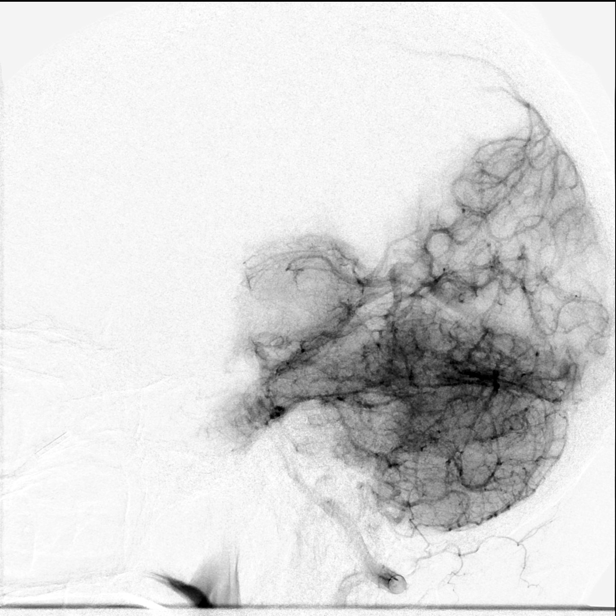
[im 249/343]
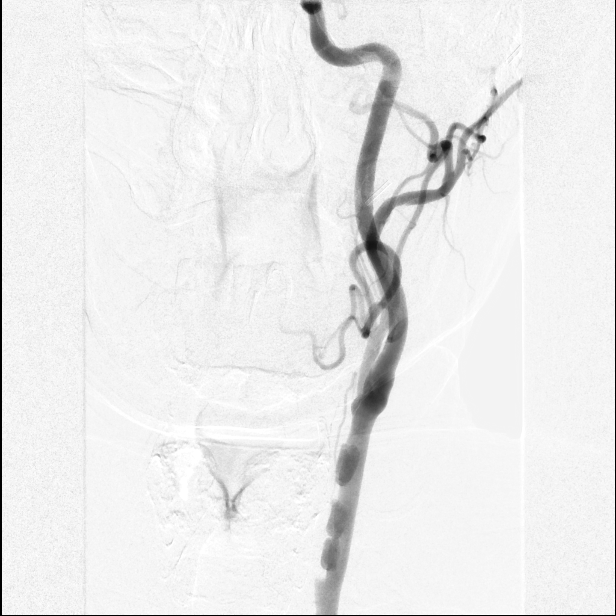
[im 280/343]
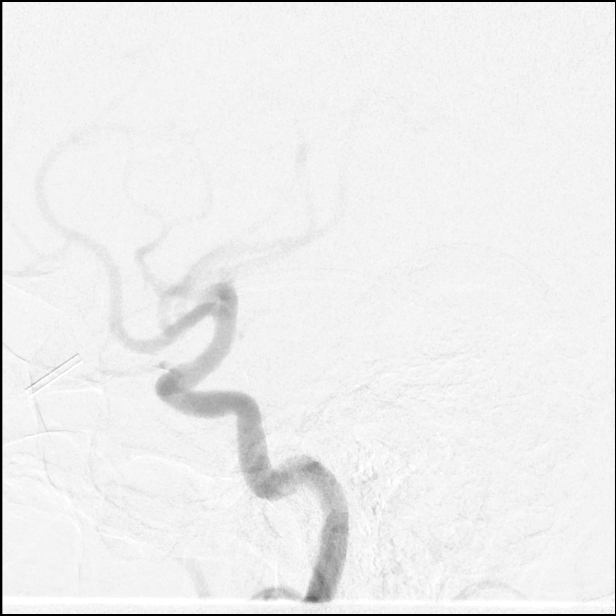
[im 311/343]
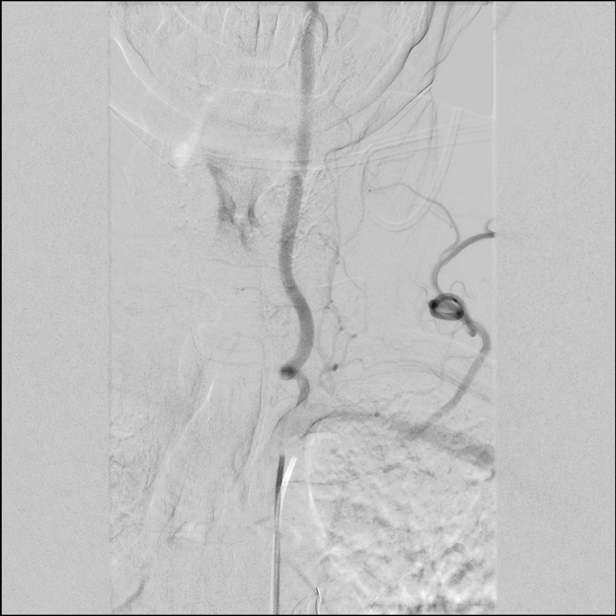
[im 343/343]
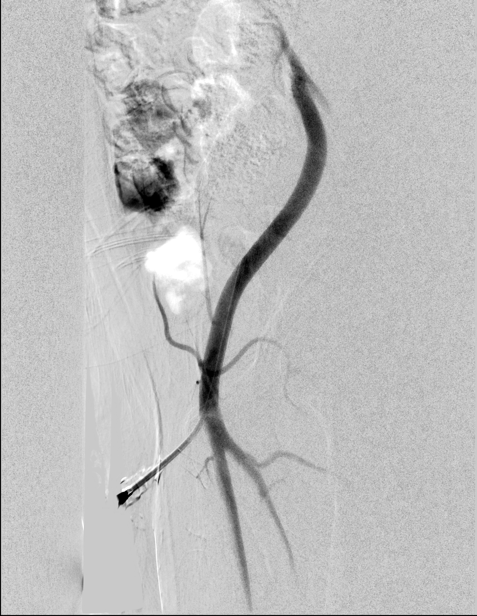

[12 of 24 positions shown; findings below may reference images not displayed]

MEDICATIONS:
Heparin [97] units IV; no antibiotic was administered within 1 hour
of the procedure.

ANESTHESIA/SEDATION:
Versed 1 mg IV; Fentanyl 50 mcg IV

Moderate Sedation Time:  32 minutes

The patient was continuously monitored during the procedure by the
interventional radiology nurse under my direct supervision.

CONTRAST:  Isovue 300 approximately 60 mL.

FLUOROSCOPY TIME:  Fluoroscopy Time: 9 minutes 30 seconds (894 mGy).

COMPLICATIONS:
None immediate.
The right groin was prepped and draped in the usual sterile fashion.
Thereafter using modified Seldinger technique, transfemoral access
into the right common femoral artery was obtained without
difficulty. Over a 0.035 inch guidewire, a 5 French Pinnacle sheath
was inserted. Through this, and also over 0.035 inch guidewire, a 5
French JB 1 catheter was advanced to the aortic arch region and
selectively positioned in the right common carotid artery, the right
vertebral artery, the left common carotid artery and the left
vertebral artery.
FINDINGS: The right common carotid arteriogram demonstrates the right external
carotid artery at its origin to be mildly narrowed. Its branches
opacify normally.

The right internal carotid artery at the bulb also demonstrates
minimal smooth atherosclerotic plaque along the posterior wall. No
evidence of ulcerations or of intraluminal filling defects is seen.

The right internal carotid artery ascends normally to the cranial
skull base. The petrous, cavernous and the supraclinoid segments are
widely patent.

The right middle cerebral artery and the right anterior cerebral
artery opacify into the capillary and venous phases.

Predominance venous drainage is via the right transverse and sigmoid
sinuses.

The origin of the right vertebral artery is widely patent.

The vessel opacifies normally to the cranial skull base. Wide
patency is seen of the right vertebrobasilar junction and the right
posterior-inferior cerebellar artery.

The basilar artery, the posterior cerebral arteries, the superior
cerebellar arteries and the anterior-inferior cerebellar arteries
opacify into the capillary and venous phases. Unopacified blood is
seen in the basilar artery from the contralateral vertebral artery.

The venous phase demonstrates a non dominant left transverse sinus
in its proximal [DATE].

The left common carotid arteriogram demonstrates multiple saccular
outpouching arising from the medial aspect of the left common
carotid artery in its distal [DATE].

The outpouchings measure from proximal to distal 7.6 mm x 3.8 mm, 5
mm x 7 mm, and 10 mm x 3.5 mm.

An additional smaller outpouching is seen the mid segment of the
left common carotid artery.

These are associated with mild-to-moderate stenosis most prominent
along the most proximal aspect. The left external carotid artery and
its major branches are widely patent.

The left internal carotid artery at the bulb to the cranial skull
base demonstrates wide patency.

The petrous, cavernous and the supraclinoid segments are widely
patent.

The left middle cerebral artery and the left anterior cerebral
artery opacify into the capillary and venous phases.

There is transient cross-filling via the anterior communicating
artery of the right anterior cerebral A2 segment and distally.

The origin of the left vertebral artery is widely patent. The vessel
opacifies to the cranial skull base. Wide patency is seen of the
left vertebrobasilar junction and the left posterior-inferior
cerebellar artery.

The basilar artery, the posterior cerebral arteries, the superior
cerebellar arteries and the anterior-inferior cerebellar arteries
opacify into the capillary and venous phases.

Unopacified blood is seen in the basilar artery from the
contralateral vertebral artery.
IMPRESSION: Multiple focal outpouchings arising from the medial aspect of the
left common carotid artery as described.

These represents pseudoaneurysms arising probably related to
previous or radiation sequela.

Associated 50% stenosis at its maximum.

PLAN:
Findings reviewed with the patient also the patient's referring
hospitalist.

The patient was advised to add Plavix to his aspirin regimen. The
patient was also advised to return in consultation in a couple of
weeks to further discuss management of the above angiographic
findings.

The patient expressed understanding and agreement with the above
management plan.

## 2019-08-23 MED ORDER — IOHEXOL 300 MG/ML  SOLN
50.0000 mL | Freq: Once | INTRAMUSCULAR | Status: AC | PRN
  Administered 2019-08-23: 5 mL via INTRA_ARTERIAL

## 2019-08-23 MED ORDER — FENTANYL CITRATE (PF) 100 MCG/2ML IJ SOLN
INTRAMUSCULAR | Status: AC
  Filled 2019-08-23: qty 2

## 2019-08-23 MED ORDER — ATORVASTATIN CALCIUM 40 MG PO TABS: 40.0000 mg | ORAL_TABLET | Freq: Every day | ORAL | 0 refills | Status: DC

## 2019-08-23 MED ORDER — MIDAZOLAM HCL 2 MG/2ML IJ SOLN
INTRAMUSCULAR | Status: AC
  Filled 2019-08-23: qty 2

## 2019-08-23 MED ORDER — FENTANYL CITRATE (PF) 100 MCG/2ML IJ SOLN
INTRAMUSCULAR | Status: AC | PRN
  Administered 2019-08-23: 25 ug via INTRAVENOUS
  Administered 2019-08-23: 12.5 ug via INTRAVENOUS

## 2019-08-23 MED ORDER — SODIUM CHLORIDE 0.9 % IV SOLN: INTRAVENOUS | Status: AC

## 2019-08-23 MED ORDER — HEPARIN SODIUM (PORCINE) 1000 UNIT/ML IJ SOLN
INTRAMUSCULAR | Status: AC
  Filled 2019-08-23: qty 1

## 2019-08-23 MED ORDER — LIDOCAINE HCL (PF) 1 % IJ SOLN
INTRAMUSCULAR | Status: AC | PRN
  Administered 2019-08-23: 5 mL

## 2019-08-23 MED ORDER — IOHEXOL 300 MG/ML  SOLN
150.0000 mL | Freq: Once | INTRAMUSCULAR | Status: AC | PRN
  Administered 2019-08-23: 75 mL via INTRA_ARTERIAL

## 2019-08-23 MED ORDER — CLOPIDOGREL BISULFATE 75 MG PO TABS: 75.0000 mg | ORAL_TABLET | Freq: Every day | ORAL | 2 refills | Status: AC

## 2019-08-23 MED ORDER — LIDOCAINE HCL 1 % IJ SOLN
INTRAMUSCULAR | Status: AC
  Filled 2019-08-23: qty 20

## 2019-08-23 MED ORDER — AMLODIPINE BESYLATE 2.5 MG PO TABS: 2.5000 mg | ORAL_TABLET | Freq: Every day | ORAL | 0 refills | Status: DC

## 2019-08-23 MED ORDER — HEPARIN SODIUM (PORCINE) 1000 UNIT/ML IJ SOLN
INTRAMUSCULAR | Status: AC | PRN
  Administered 2019-08-23: 1000 [IU] via INTRAVENOUS

## 2019-08-23 MED ORDER — MIDAZOLAM HCL 2 MG/2ML IJ SOLN
INTRAMUSCULAR | Status: AC | PRN
  Administered 2019-08-23 (×2): 0.5 mg via INTRAVENOUS

## 2019-08-23 MED ORDER — LIDOCAINE HCL (PF) 1 % IJ SOLN
INTRAMUSCULAR | Status: AC | PRN
  Administered 2019-08-23: 10 mL

## 2019-08-23 MED ORDER — ENOXAPARIN SODIUM 40 MG/0.4ML ~~LOC~~ SOLN: 40.0000 mg | SUBCUTANEOUS | Status: DC

## 2019-08-23 NOTE — Progress Notes (Signed)
SLP Cancellation Note  Patient Details Name: WASEEM DELUCCHI MRN: [id number] DOB: 12-26-1950   Cancelled treatment:       Reason Eval/Treat Not Completed: SLP screened, no needs identified, will sign off. MRI negative, symptoms have resolved.    Lillian Tigges, Katherene Ponto 08/23/2019, 7:13 AM

## 2019-08-23 NOTE — Progress Notes (Signed)
Discharge instructions reviewed with patient, Patient verbalized understanding. All questions answered for patient. IV removed without complications. All personal belongings collected from the room.

## 2019-08-23 NOTE — Progress Notes (Signed)
PT Discharge Note  Patient Details Name: Samuel Barr MRN: [id number] DOB: [birth year]   Cancelled Treatment:    Reason Eval/Treat Not Completed: PT screened, no needs identified, will sign off  Patient notes no change in balance or mobility. Has been getting himself up in the room with no issues. He declines PT workup at this time. Will sign off.   Melvern Banker 08/23/2019, 9:42 AM Lavonia Dana, PT   Acute Rehabilitation Services  Pager [phone] Office [phone] 08/23/2019

## 2019-08-23 NOTE — Procedures (Signed)
S/P 4 vessel cerebral arteriogram Rt CFA approach. Findings. 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

## 2019-08-23 NOTE — TOC Initial Note (Signed)
Transition of Care [facility]) - Initial/Assessment Note    Patient Details  Name: Samuel Barr MRN: [id number] Date of Birth: Feb 24, 1951  Transition of Care [facility]) CM/SW Contact:    Pollie Friar, RN Phone Number: 08/23/2019, 2:19 PM  Clinical Narrative:                 Pt lives home with his spouse that can provide needed supervision. No f/u per PT.  Pt has transportation home when medically ready.   Expected Discharge Plan: Home/Self Care Barriers to Discharge: Continued Medical Work up   Patient Goals and CMS Choice        Expected Discharge Plan and Services Expected Discharge Plan: Home/Self Care       Living arrangements for the past 2 months: Single Family Home                                      Prior Living Arrangements/Services Living arrangements for the past 2 months: Single Family Home Lives with:: Spouse Patient language and need for interpreter reviewed:: No Do you feel safe going back to the place where you live?: Yes      Need for Family Participation in Patient Care: No (Comment) Care giver support system in place?: Yes (comment)   Criminal Activity/Legal Involvement Pertinent to Current Situation/Hospitalization: No - Comment as needed  Activities of Daily Living Home Assistive Devices/Equipment: None ADL Screening (condition at time of admission) Patient's cognitive ability adequate to safely complete daily activities?: Yes Is the patient deaf or have difficulty hearing?: No Does the patient have difficulty seeing, even when wearing glasses/contacts?: No Does the patient have difficulty concentrating, remembering, or making decisions?: No Patient able to express need for assistance with ADLs?: Yes Does the patient have difficulty dressing or bathing?: No Independently performs ADLs?: Yes (appropriate for developmental age) Does the patient have difficulty walking or climbing stairs?: No Weakness of Legs: None Weakness of Arms/Hands:  None  Permission Sought/Granted                  Emotional Assessment Appearance:: Appears stated age   Affect (typically observed): Accepting, Pleasant Orientation: : Oriented to Self, Oriented to Place, Oriented to  Time, Oriented to Situation   Psych Involvement: No (comment)  Admission diagnosis:  TIA (transient ischemic attack) [G45.9] Dysarthria [R47.1] Patient Active Problem List   Diagnosis Date Noted  . TIA (transient ischemic attack) 08/22/2019   PCP:  Redmond School, MD Pharmacy:   [id number] - [place], [address] [place] [address] Phone: [phone] Fax: [phone]     Social Determinants of Health (SDOH) Interventions    Readmission Risk Interventions No flowsheet data found.

## 2019-08-23 NOTE — Progress Notes (Signed)
  Echocardiogram 2D Echocardiogram has been performed.  Burnett Kanaris 08/23/2019, 9:38 AM

## 2019-08-23 NOTE — Care Management Obs Status (Signed)
[place] NOTIFICATION   Patient Details  Name: Samuel Barr MRN: [id number] Date of Birth: 1951-01-14   Medicare Observation Status Notification Given:  Yes    Pollie Friar, RN 08/23/2019, 2:14 PM

## 2019-08-23 NOTE — Consult Note (Signed)
Chief Complaint: Patient was seen in consultation today for cerebral arteriogram Chief Complaint  Patient presents with  . Aphasia    Resolved   at the request of Dr Arlyce Dice  Referring Physician(s): Dr Merlene Laughter  Supervising Physician: Luanne Bras  Patient Status: [facility] - In-pt  History of Present Illness: Samuel Barr is a 68 y.o. 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 The left carotid bifurcation and cervical left ICA seem unaffected. Recommend follow-up CTA neck with IV contrast to confirm. 2. Otherwise negative neck MRA. No cervical right carotid or vertebral artery abnormality identified. IMPRESSION: No acute infarct identified, but there is a solitary 7 mm area of nonspecific signal abnormality in the left cerebellar peduncle. Elsewhere the brain appears within normal limits for age.  CTA: IMPRESSION: 1. Abnormal left common carotid artery which along a segment of about 3.5 cm proximal to the bifurcation has an appearance of multiple saccular lesions which might represent a combination of dissection, penetrating ulcers, pseudoaneurysms. Immediately upstream there is bulky low-density plaque within the vessel. And in the abnormal segment subsequent stenosis of about 50% occurs. The appearance normalizes proximal to the carotid bifurcation where there is only mild atherosclerosis and no other left ICA stenosis through the visible siphon.   Request for cerebral arteriogram Dr Estanislado Pandy is aware of pt and approves procedure   Past Medical History:  Diagnosis Date  . Cancer [facility]) 2008    Past Surgical  History:  Procedure Laterality Date  . EYE SURGERY    . HERNIA REPAIR    . TONSILLECTOMY Left    cancer    Allergies: Patient has no known allergies.  Medications: Prior to Admission medications   Medication Sig Start Date End Date Taking? Authorizing Provider  LANTUS SOLOSTAR 100 UNIT/ML Solostar Pen Inject 50-60 Units into the skin at bedtime. 07/30/19  Yes [provider]  levothyroxine (SYNTHROID, LEVOTHROID) 25 MCG tablet Take 25 mcg by mouth daily before breakfast.   Yes [provider]  metFORMIN (GLUCOPHAGE) 500 MG tablet Take 500 mg by mouth 2 (two) times daily with a meal.   Yes [provider]  aspirin 81 MG chewable tablet Chew 81 mg by mouth daily.    [provider]  diazepam (VALIUM) 5 MG tablet Take 5-10 mg by mouth at bedtime as needed. 07/23/19   [provider]     Family History  Problem Relation Age of Onset  . Diabetes Mother   . Heart attack Father     Social History   Socioeconomic History  . Marital status: Married    Spouse name: Not on file  . Number of children: Not on file  . Years of education: Not on file  . Highest education level: Not on file  Occupational History  . Not on file  Tobacco Use  . Smoking status: Former Smoker    Quit date: 09/08/1979    Years since quitting: 39.9  . Smokeless tobacco: Never Used  Substance and Sexual Activity  . Alcohol use: Not Currently    Comment: occasional  . Drug use: Yes    Comment: occas  . Sexual activity:  Not on file  Other Topics Concern  . Not on file  Social History Narrative  . Not on file   Social Determinants of Health   Financial Resource Strain:   . Difficulty of Paying Living Expenses: Not on file  Food Insecurity:   . Worried About Charity fundraiser in the Last Year: Not on file  . Ran Out of Food in the Last Year: Not on file  Transportation Needs:   . Lack of Transportation (Medical): Not on file  . Lack of Transportation  (Non-Medical): Not on file  Physical Activity:   . Days of Exercise per Week: Not on file  . Minutes of Exercise per Session: Not on file  Stress:   . Feeling of Stress : Not on file  Social Connections:   . Frequency of Communication with Friends and Family: Not on file  . Frequency of Social Gatherings with Friends and Family: Not on file  . Attends Religious Services: Not on file  . Active Member of Clubs or Organizations: Not on file  . Attends Archivist Meetings: Not on file  . Marital Status: Not on file    Review of Systems: A 12 point ROS discussed and pertinent positives are indicated in the HPI above.  All other systems are negative.  Review of Systems  Constitutional: Negative for activity change, appetite change, fatigue and fever.  HENT: Negative for drooling, facial swelling, hearing loss, sore throat, tinnitus and trouble swallowing.   Eyes: Negative for visual disturbance.  Respiratory: Negative for cough and shortness of breath.   Cardiovascular: Negative for chest pain.  Gastrointestinal: Negative for abdominal pain.  Musculoskeletal: Negative for back pain.  Neurological: Negative for dizziness, tremors, seizures, syncope, facial asymmetry, speech difficulty, weakness, light-headedness, numbness and headaches.  Psychiatric/Behavioral: Negative for behavioral problems and confusion.    Vital Signs: BP 118/73 (BP Location: Right Arm)   Pulse 69   Temp 97.8 F (36.6 C) (Oral)   Resp 15   Ht 5\' 10"  (1.778 m)   Wt 181 lb (82.1 kg)   SpO2 97%   BMI 25.97 kg/m   Physical Exam Vitals reviewed.  HENT:     Mouth/Throat:     Mouth: Mucous membranes are moist.  Eyes:     Extraocular Movements: Extraocular movements intact.  Cardiovascular:     Rate and Rhythm: Normal rate and regular rhythm.     Heart sounds: Normal heart sounds.  Pulmonary:     Effort: Pulmonary effort is normal.     Breath sounds: Normal breath sounds.  Abdominal:      Palpations: Abdomen is soft.     Tenderness: There is no abdominal tenderness.  Musculoskeletal:        General: Normal range of motion.     Right lower leg: No edema.     Left lower leg: No edema.  Skin:    General: Skin is warm and dry.  Neurological:     Mental Status: He is alert and oriented to person, place, and time.  Psychiatric:        Mood and Affect: Mood normal.        Behavior: Behavior normal.        Thought Content: Thought content normal.        Judgment: Judgment normal.     Imaging: CT HEAD WO CONTRAST  Result Date: 08/22/2019 CLINICAL DATA:  Speech difficulty. EXAM: CT HEAD WITHOUT CONTRAST TECHNIQUE: Contiguous axial images were obtained from the  base of the skull through the vertex without intravenous contrast. COMPARISON:  05/21/2013 FINDINGS: Brain: There is no evidence of acute infarct, intracranial hemorrhage, mass, midline shift, or extra-axial fluid collection. The ventricles and sulci are normal. Bilateral globus pallidus mineralization has slightly progressed. Vascular: Calcified atherosclerosis at the skull base. No hyperdense vessel. Skull: No fracture or focal osseous lesion. Sinuses/Orbits: Visualized paranasal sinuses and mastoid air cells are clear. Orbits are unremarkable. Other: None. IMPRESSION: No evidence of acute intracranial abnormality. Electronically Signed   By: Logan Bores M.D.   On: 08/22/2019 12:33   CT Angio Neck W and/or Wo Contrast  Result Date: 08/22/2019 CLINICAL DATA:  68 year old male with transient abnormal speech today. Abnormal appearance of the left CCA on neck MRA today. Reportedly there is a history of treated head and neck cancer. Normal intracranial MRA today. EXAM: CT ANGIOGRAPHY NECK TECHNIQUE: Multidetector CT imaging of the neck was performed using the standard protocol during bolus administration of intravenous contrast. Multiplanar CT image reconstructions and MIPs were obtained to evaluate the vascular anatomy. Carotid  stenosis measurements (when applicable) are obtained utilizing NASCET criteria, using the distal internal carotid diameter as the denominator. CONTRAST:  192mL OMNIPAQUE IOHEXOL 350 MG/ML SOLN COMPARISON:  Head and neck MRA earlier today. FINDINGS: Skeleton: Absent maxillary and posterior mandible dentition. Mild for age cervical spine degeneration. No acute or suspicious osseous lesion. There is T1 spina bifida occulta, normal variant. Upper chest: Negative upper lungs. No superior mediastinal lymphadenopathy. Other neck: Diminutive or absent left thyroid lobe. No surgical clips in the neck. But the left submandibular gland is absent. Laryngeal and pharyngeal soft tissue contours are within normal limits. Negative parapharyngeal, retropharyngeal, sublingual spaces. The left parotid gland is partially atrophied. Right submandibular and parotid glands are within normal limits. There is an obscuration of some of the left lateral neck soft tissue planes compatible with prior radiation therapy. The left SCM is present. The left IJ is present but diminutive. Aortic arch: 3 vessel arch configuration. Mild for age arch atherosclerosis. Right carotid system: Mild brachiocephalic artery soft and calcified plaque without stenosis. Negative right CCA origin. Minimal soft plaque in the right CCA proximal to the bifurcation without stenosis. Mild soft and calcified plaque at the medial right ICA origin and bulb without stenosis. Visible right ICA siphon is patent with mild calcified plaque. Left carotid system: Soft and calcified plaque at the left CCA origin without stenosis. Bulky and low-density soft plaque in the medial and ventral left CCA at the level of the thyroid on series 6, image 52. Immediately beyond this section the medial and anterior vessel wall is highly abnormal with a multifocal saccular and undulating appearance (series 8, image 124) as seen on the MRA today. This is along a segment of about 3.5 centimeters  of the vessel. There are areas on the axial images which suggest an intimal flap (series 4, image 57) but otherwise the appearance is not typical of dissection. Multiple large penetrating ulcers, with contained pseudoaneurysms is another consideration. The largest discrete lesion would be about 10 millimeters in length by 4-5 millimeters with. Regardless, the abnormality does result in about 50% stenosis of the left CCA. The left carotid bifurcation is patent with a more normal appearance. There is relatively mild and mostly calcified plaque at the left ICA origin. The left ICA is then patent to the skull base without additional plaque or abnormality. Visible left ICA siphon is patent with mild calcified plaque. Vertebral arteries: Calcified plaque in the  proximal right subclavian artery abutting the right vertebral artery origin but without associated stenosis (series 7, image 133). The right vertebral appears mildly dominant and is patent to the vertebrobasilar junction without stenosis. Negative visible basilar artery. Soft and calcified plaque in the proximal left subclavian artery without stenosis. Normal left vertebral artery origin. Tortuous left V1 segment. Patent left vertebral artery to the vertebrobasilar junction without stenosis. Patent PICA origins. Review of the MIP images confirms the above findings IMPRESSION: 1. Abnormal left common carotid artery which along a segment of about 3.5 cm proximal to the bifurcation has an appearance of multiple saccular lesions which might represent a combination of dissection, penetrating ulcers, pseudoaneurysms. Immediately upstream there is bulky low-density plaque within the vessel. And in the abnormal segment subsequent stenosis of about 50% occurs. The appearance normalizes proximal to the carotid bifurcation where there is only mild atherosclerosis and no other left ICA stenosis through the visible siphon. 2. Fairly mild for age atherosclerosis elsewhere in the  neck and at the visible skull base. Note also MRA of the head and neck was performed earlier today. 3. Evidence of prior left neck radiation therapy. No neck mass or lymphadenopathy identified. The left submandibular gland may be atrophied or could have been resected. Study discussed by telephone with Dr. Stark Jock in the ED on 08/22/2019 at 16:20 . Electronically Signed   By: Genevie Ann M.D.   On: 08/22/2019 16:22   MR ANGIO HEAD WO CONTRAST  Result Date: 08/22/2019 CLINICAL DATA:  68 year old male with abnormal speech, stroke symptoms. EXAM: MRA HEAD WITHOUT CONTRAST TECHNIQUE: Angiographic images of the Circle of Willis were obtained using MRA technique without intravenous contrast. COMPARISON:  Brain MRI and head CT earlier today. FINDINGS: Antegrade flow in the posterior circulation with mildly dominant distal right vertebral artery. No distal vertebral or vertebrobasilar junction stenosis. Patent PICA origins. Patent basilar artery without stenosis. Normal SCA and PCA origins. Posterior communicating arteries are diminutive or absent. Bilateral PCA branches are within normal limits. Antegrade flow in both ICA siphons. No siphon stenosis. Ectasia of the right ophthalmic artery origin is suspected. No ICA siphon aneurysm. Patent carotid termini. Normal MCA and ACA origins. Anterior communicating artery diminutive or absent. Visible ACA branches are within normal limits. Bilateral MCA M1 segments and MCA bi/trifurcations are patent without stenosis. Visible MCA branches are within normal limits. IMPRESSION: Negative intracranial MRA. Electronically Signed   By: Genevie Ann M.D.   On: 08/22/2019 14:48   MR Angiogram Neck W or Wo Contrast  Addendum Date: 08/22/2019   ADDENDUM REPORT: 08/22/2019 15:20 ADDENDUM: Study discussed by telephone with Dr. Noemi Chapel on 08/22/2019 at 1457 hours. He advises the patient has had previously treated head and neck cancer. It is possible that the left common carotid artery finding  is susceptibility artifact. CTA should confirm. Electronically Signed   By: Genevie Ann M.D.   On: 08/22/2019 15:20   Result Date: 08/22/2019 CLINICAL DATA:  68 year old male with abnormal speech. Stroke-like symptoms. EXAM: MRA NECK WITHOUT AND WITH CONTRAST TECHNIQUE: Multiplanar and multiecho pulse sequences of the neck were obtained without and with intravenous contrast. Angiographic images of the neck were obtained using MRA technique without and with intravenous contrast. CONTRAST:  64mL GADAVIST GADOBUTROL 1 MMOL/ML IV SOLN COMPARISON:  Brain MRI and intracranial MRA today reported separately. FINDINGS: Precontrast time-of-flight imaging demonstrates antegrade flow in the bilateral cervical carotid and vertebral arteries. The carotid bifurcations appear within normal limits. The right vertebral appears slightly dominant. Post-contrast  neck MRA images reveal a 3 vessel arch configuration. No arch or proximal great vessel atherosclerosis or stenosis is evident. However, there is an abnormal undulating appearance of the anterior contour of the left common carotid artery in the neck (series 9, image 35 and series 105, image 23) which abates just proximal to the bifurcation. This is associated with abnormal linear decreased time-of-flight signal in the vessel on series 4, image 83. The left carotid bifurcation appears normally patent and the cervical left ICA appears normal. Negative visible left anterior circulation. Contralateral right CCA, right carotid bifurcation and cervical right ICA appear within normal limits. Negative visible right anterior circulation. The proximal subclavian arteries and vertebral artery origins appear patent without stenosis. Tortuous V1 segments. No cervical vertebral artery irregularity or stenosis. Negative visible posterior circulation. IMPRESSION: 1. 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 The left carotid bifurcation and  cervical left ICA seem unaffected. Recommend follow-up CTA neck with IV contrast to confirm. 2. Otherwise negative neck MRA. No cervical right carotid or vertebral artery abnormality identified. Electronically Signed: By: Genevie Ann M.D. On: 08/22/2019 14:53   MR BRAIN WO CONTRAST  Result Date: 08/22/2019 CLINICAL DATA:  68 year old male with abnormal speech, stroke symptoms. EXAM: MRI HEAD WITHOUT CONTRAST TECHNIQUE: Multiplanar, multiecho pulse sequences of the brain and surrounding structures were obtained without intravenous contrast. COMPARISON:  Head CT earlier today. FINDINGS: Brain: No restricted diffusion or evidence of acute infarction. No midline shift, mass effect, evidence of mass lesion, ventriculomegaly, extra-axial collection or acute intracranial hemorrhage. Cervicomedullary junction and pituitary are within normal limits. A small intraventricular adhesion is suspected near the right frontal horn (series 7, image 15, normal variant). There is abnormal T2 and FLAIR hyperintensity in the left cerebellar peduncle on series 7 image 7 where diffusion appears facilitated (series 4, image 14). The abnormality is about 6-7 millimeters. The trace diffusion image does appear mildly abnormal here. T1 signal is also mildly decreased. No associated hemorrhage. Elsewhere gray and white matter signal appears within normal limits for age, including in the brainstem. No cortical encephalomalacia or chronic cerebral blood products identified. Cerebral volume appears within normal limits for age. Vascular: Major intracranial vascular flow voids are preserved. Skull and upper cervical spine: Negative visible cervical spine. Visualized bone marrow signal is within normal limits. Sinuses/Orbits: Negative orbits. Paranasal sinuses and mastoids are stable and well pneumatized. Other: Visible internal auditory structures appear normal. Scalp and face soft tissues appear negative. IMPRESSION: No acute infarct identified,  but there is a solitary 7 mm area of nonspecific signal abnormality in the left cerebellar peduncle. Elsewhere the brain appears within normal limits for age. Follow-up post-contrast imaging is recommended to exclude neoplasm. But otherwise this resembles a nonspecific area of gliosis, with no associated mass effect, hemosiderin or mineralization. No associated brainstem or cerebellar abnormality. Electronically Signed   By: Genevie Ann M.D.   On: 08/22/2019 14:46    Labs:  CBC: Recent Labs    08/22/19 1223  WBC 3.7*  HGB 14.2  HCT 41.5  PLT 252    COAGS: Recent Labs    08/22/19 1223  INR 1.1  APTT 47*    BMP: Recent Labs    08/22/19 1223  NA 138  K 4.3  CL 101  CO2 27  GLUCOSE 274*  BUN 12  CALCIUM 9.3  CREATININE 0.96  GFRNONAA >60  GFRAA >60    LIVER FUNCTION TESTS: Recent Labs  08/22/19 1223  BILITOT 0.6  AST 21  ALT 27  ALKPHOS 51  PROT 7.0  ALBUMIN 4.0    TUMOR MARKERS: No results for input(s): AFPTM, CEA, CA199, CHROMGRNA in the last 8760 hours.  Assessment and Plan:  Hx L Tonsillar Ca 2009--- post surgery and radiation New onset transient slurred speech yesterday-- no recurrence CTA reveals L CCA abnormal appearance Scheduled now for cerebral arteriogram Risks and benefits of cerebral angiogram with intervention were discussed with the patient including, but not limited to bleeding, infection, vascular injury, contrast induced renal failure, stroke or even death.  This interventional procedure involves the use of X-rays and because of the nature of the planned procedure, it is possible that we will have prolonged use of X-ray fluoroscopy.  Potential radiation risks to you include (but are not limited to) the following: - A slightly elevated risk for cancer  several years later in life. This risk is typically less than 0.5% percent. This risk is low in comparison to the normal incidence of human cancer, which is 33% for women and 50% for men  according to the [place]. - Radiation induced injury can include skin redness, resembling a rash, tissue breakdown / ulcers and hair loss (which can be temporary or permanent).   The likelihood of either of these occurring depends on the difficulty of the procedure and whether you are sensitive to radiation due to previous procedures, disease, or genetic conditions.   IF your procedure requires a prolonged use of radiation, you will be notified and given written instructions for further action.  It is your responsibility to monitor the irradiated area for the 2 weeks following the procedure and to notify your physician if you are concerned that you have suffered a radiation induced injury.    All of the patient's questions were answered, patient is agreeable to proceed.  Consent signed and in chart.  Thank you for this interesting consult.  I greatly enjoyed meeting THEOPHILUS WILDEBOER and look forward to participating in their care.  A copy of this report was sent to the requesting provider on this date.  Electronically Signed: Lavonia Drafts, PA-C 08/23/2019, 7:14 AM   I spent a total of 40 Minutes    in face to face in clinical consultation, greater than 50% of which was counseling/coordinating care for cerebral arteriogram

## 2019-08-23 NOTE — Progress Notes (Signed)
New admit this shift. Slept well during night, bp improved. 0 c/o pain or distress. NPO for testing. Given education overnight but we need reinforcement

## 2019-08-23 NOTE — Discharge Summary (Signed)
Physician Discharge Summary  Samuel Barr [id number] DOB: [birth year] DOA: 08/22/2019  PCP: Redmond School, MD  Admit date: 08/22/2019 Discharge date: 08/23/2019  Admitted From: Home Disposition: Home  Recommendations for Outpatient Follow-up:  1. Follow up with PCP in 1-2 weeks 2. Follow with Dr. Estanislado Pandy in 2 weeks 3. Please obtain BMP/CBC in one week 4. Please follow up on the following pending results:  Home Health: None Equipment/Devices: None  Discharge Condition: Stable CODE STATUS: Full code Diet recommendation: Cardiac  Subjective: Seen and examined after his procedure.  No complaints.  Did not have any neuro complaints or return of slurred speech since the 1 he had yesterday which lasted briefly as well.  HPI: Samuel Barr is a 68 y.o. 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 Patient presented to the ED with complaints of slurred speech that lasted about 3 minutes that occurred morning while he was eating breakfast.  It has not reoccurred.  No facial asymmetry, no vision change, and no weakness of his extremity, no abnormal sensation.  ED Course: Blood pressure elevated systolic [id number] to [id number].  CT head negative for acute intracranial abnormality. EDP talked to Dr. Merlene Laughter who recommended MRI MRA brain, if negative, discharged to follow-up as outpatient and switched over to Plavix.  MRI brain was negative for acute infarct, showed a solitary 7 mm area of nonspecific signal abnormality in the left cerebellar peduncle.  Follow-up postcontrast imaging recommended to exclude neoplasm.  MRA head unremarkable.  MRA neck-abnormal consult the left common carotid artery in the neck suspicious for arterial dissection.  CTA neck with contrast recommended-abnormal left common carotid artery appearance of multiple saccular lesions which would represent a combination of dissection,  penetrating ulcer, pseudoaneurysm. EDP talked to interventional radiologist recommended admission to [facility] with plans for IR diagnostic angiography tomorrow.  Brief/Interim Summary: Patient was admitted with possible stroke.  MRI was negative for any acute stroke.  He was started on Plavix and aspirin was continued.  He was seen by neurology.  Transthoracic echo showed normal ejection fraction with no PFO.  Since his symptoms resolved and there was nothing on the MRI so neurology signed off.  MRA neck and subsequently CT angiogram of the neck showed left common carotid artery stenosis.  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  Per my discussion with Dr. Estanislado Pandy, patient could go home but he recommended that he continues aspirin as well as Plavix and sees him in the office in 2 weeks and then he is going to explain further and perhaps repeat angiogram in 3 weeks.  Patient was also started on amlodipine due to high blood pressure.  Now that he has been cleared by IR and has no symptoms so he is going to be discharged in stable condition with aspirin, Plavix, amlodipine as well as atorvastatin.  Follow-up with PCP.  Discharge Diagnoses:  Active Problems:   TIA (transient ischemic attack)   Common carotid artery stenosis    Discharge Instructions  Discharge Instructions    Discharge patient   Complete by: As directed    Discharge disposition: 01-Home or Self Care   Discharge patient date: 08/23/2019     Allergies as of 08/23/2019  No Known Allergies     Medication List    TAKE these medications   amLODipine 2.5 MG tablet Commonly known as: NORVASC Take 1 tablet (2.5 mg total) by mouth daily.   aspirin 81 MG chewable tablet Chew 81 mg by mouth daily.   atorvastatin 40 MG  tablet Commonly known as: LIPITOR Take 1 tablet (40 mg total) by mouth daily at 6 PM.   clopidogrel 75 MG tablet Commonly known as: PLAVIX Take 1 tablet (75 mg total) by mouth daily. Start taking on: August 24, 2019   diazepam 5 MG tablet Commonly known as: VALIUM Take 5-10 mg by mouth at bedtime as needed.   Lantus SoloStar 100 UNIT/ML Solostar Pen Generic drug: Insulin Glargine Inject 50-60 Units into the skin at bedtime.   levothyroxine 25 MCG tablet Commonly known as: SYNTHROID Take 25 mcg by mouth daily before breakfast.   metFORMIN 500 MG tablet Commonly known as: GLUCOPHAGE Take 500 mg by mouth 2 (two) times daily with a meal.      Follow-up Information    Redmond School, MD Follow up in 1 week(s).   Specialty: Internal Medicine Contact information: [address] [place] [id number] [phone]        Arnoldo Lenis, MD .   Specialty: Cardiology Contact information: [address] [place] [address] [phone]        Luanne Bras, MD Follow up in 2 week(s).   Specialties: Interventional Radiology, Radiology Contact information: [place] [address] [phone]          No Known Allergies  Consultations: IR and neurology   Procedures/Studies: CT HEAD WO CONTRAST  Result Date: 08/22/2019 CLINICAL DATA:  Speech difficulty. EXAM: CT HEAD WITHOUT CONTRAST TECHNIQUE: Contiguous axial images were obtained from the base of the skull through the vertex without intravenous contrast. COMPARISON:  05/21/2013 FINDINGS: Brain: There is no evidence of acute infarct, intracranial hemorrhage, mass, midline shift, or extra-axial fluid collection. The ventricles and sulci are normal. Bilateral globus pallidus mineralization has slightly progressed. Vascular: Calcified atherosclerosis at the skull base. No hyperdense vessel. Skull: No fracture or focal osseous lesion. Sinuses/Orbits: Visualized paranasal sinuses and  mastoid air cells are clear. Orbits are unremarkable. Other: None. IMPRESSION: No evidence of acute intracranial abnormality. Electronically Signed   By: Logan Bores M.D.   On: 08/22/2019 12:33   CT Angio Neck W and/or Wo Contrast  Result Date: 08/22/2019 CLINICAL DATA:  68 year old male with transient abnormal speech today. Abnormal appearance of the left CCA on neck MRA today. Reportedly there is a history of treated head and neck cancer. Normal intracranial MRA today. EXAM: CT ANGIOGRAPHY NECK TECHNIQUE: Multidetector CT imaging of the neck was performed using the standard protocol during bolus administration of intravenous contrast. Multiplanar CT image reconstructions and MIPs were obtained to evaluate the vascular anatomy. Carotid stenosis measurements (when applicable) are obtained utilizing NASCET criteria, using the distal internal carotid diameter as the denominator. CONTRAST:  159mL OMNIPAQUE IOHEXOL 350 MG/ML SOLN COMPARISON:  Head and neck MRA earlier today. FINDINGS: Skeleton: Absent maxillary and posterior mandible dentition. Mild for age cervical spine degeneration. No acute or suspicious osseous lesion. There is T1 spina bifida occulta, normal variant. Upper chest: Negative upper lungs. No superior mediastinal lymphadenopathy. Other neck: Diminutive or absent left thyroid lobe. No surgical clips in the neck. But the left submandibular gland is absent. Laryngeal and pharyngeal soft tissue contours are within normal limits. Negative parapharyngeal, retropharyngeal, sublingual spaces. The  left parotid gland is partially atrophied. Right submandibular and parotid glands are within normal limits. There is an obscuration of some of the left lateral neck soft tissue planes compatible with prior radiation therapy. The left SCM is present. The left IJ is present but diminutive. Aortic arch: 3 vessel arch configuration. Mild for age arch atherosclerosis. Right carotid system: Mild brachiocephalic artery  soft and calcified plaque without stenosis. Negative right CCA origin. Minimal soft plaque in the right CCA proximal to the bifurcation without stenosis. Mild soft and calcified plaque at the medial right ICA origin and bulb without stenosis. Visible right ICA siphon is patent with mild calcified plaque. Left carotid system: Soft and calcified plaque at the left CCA origin without stenosis. Bulky and low-density soft plaque in the medial and ventral left CCA at the level of the thyroid on series 6, image 52. Immediately beyond this section the medial and anterior vessel wall is highly abnormal with a multifocal saccular and undulating appearance (series 8, image 124) as seen on the MRA today. This is along a segment of about 3.5 centimeters of the vessel. There are areas on the axial images which suggest an intimal flap (series 4, image 57) but otherwise the appearance is not typical of dissection. Multiple large penetrating ulcers, with contained pseudoaneurysms is another consideration. The largest discrete lesion would be about 10 millimeters in length by 4-5 millimeters with. Regardless, the abnormality does result in about 50% stenosis of the left CCA. The left carotid bifurcation is patent with a more normal appearance. There is relatively mild and mostly calcified plaque at the left ICA origin. The left ICA is then patent to the skull base without additional plaque or abnormality. Visible left ICA siphon is patent with mild calcified plaque. Vertebral arteries: Calcified plaque in the proximal right subclavian artery abutting the right vertebral artery origin but without associated stenosis (series 7, image 133). The right vertebral appears mildly dominant and is patent to the vertebrobasilar junction without stenosis. Negative visible basilar artery. Soft and calcified plaque in the proximal left subclavian artery without stenosis. Normal left vertebral artery origin. Tortuous left V1 segment. Patent left  vertebral artery to the vertebrobasilar junction without stenosis. Patent PICA origins. Review of the MIP images confirms the above findings IMPRESSION: 1. Abnormal left common carotid artery which along a segment of about 3.5 cm proximal to the bifurcation has an appearance of multiple saccular lesions which might represent a combination of dissection, penetrating ulcers, pseudoaneurysms. Immediately upstream there is bulky low-density plaque within the vessel. And in the abnormal segment subsequent stenosis of about 50% occurs. The appearance normalizes proximal to the carotid bifurcation where there is only mild atherosclerosis and no other left ICA stenosis through the visible siphon. 2. Fairly mild for age atherosclerosis elsewhere in the neck and at the visible skull base. Note also MRA of the head and neck was performed earlier today. 3. Evidence of prior left neck radiation therapy. No neck mass or lymphadenopathy identified. The left submandibular gland may be atrophied or could have been resected. Study discussed by telephone with Dr. Stark Jock in the ED on 08/22/2019 at 16:20 . Electronically Signed   By: Genevie Ann M.D.   On: 08/22/2019 16:22   MR ANGIO HEAD WO CONTRAST  Result Date: 08/22/2019 CLINICAL DATA:  68 year old male with abnormal speech, stroke symptoms. EXAM: MRA HEAD WITHOUT CONTRAST TECHNIQUE: Angiographic images of the Circle of Willis were obtained using MRA technique without intravenous contrast. COMPARISON:  Brain MRI  and head CT earlier today. FINDINGS: Antegrade flow in the posterior circulation with mildly dominant distal right vertebral artery. No distal vertebral or vertebrobasilar junction stenosis. Patent PICA origins. Patent basilar artery without stenosis. Normal SCA and PCA origins. Posterior communicating arteries are diminutive or absent. Bilateral PCA branches are within normal limits. Antegrade flow in both ICA siphons. No siphon stenosis. Ectasia of the right ophthalmic  artery origin is suspected. No ICA siphon aneurysm. Patent carotid termini. Normal MCA and ACA origins. Anterior communicating artery diminutive or absent. Visible ACA branches are within normal limits. Bilateral MCA M1 segments and MCA bi/trifurcations are patent without stenosis. Visible MCA branches are within normal limits. IMPRESSION: Negative intracranial MRA. Electronically Signed   By: Genevie Ann M.D.   On: 08/22/2019 14:48   MR Angiogram Neck W or Wo Contrast  Addendum Date: 08/22/2019   ADDENDUM REPORT: 08/22/2019 15:20 ADDENDUM: Study discussed by telephone with Dr. Noemi Chapel on 08/22/2019 at 1457 hours. He advises the patient has had previously treated head and neck cancer. It is possible that the left common carotid artery finding is susceptibility artifact. CTA should confirm. Electronically Signed   By: Genevie Ann M.D.   On: 08/22/2019 15:20   Result Date: 08/22/2019 CLINICAL DATA:  68 year old male with abnormal speech. Stroke-like symptoms. EXAM: MRA NECK WITHOUT AND WITH CONTRAST TECHNIQUE: Multiplanar and multiecho pulse sequences of the neck were obtained without and with intravenous contrast. Angiographic images of the neck were obtained using MRA technique without and with intravenous contrast. CONTRAST:  45mL GADAVIST GADOBUTROL 1 MMOL/ML IV SOLN COMPARISON:  Brain MRI and intracranial MRA today reported separately. FINDINGS: Precontrast time-of-flight imaging demonstrates antegrade flow in the bilateral cervical carotid and vertebral arteries. The carotid bifurcations appear within normal limits. The right vertebral appears slightly dominant. Post-contrast neck MRA images reveal a 3 vessel arch configuration. No arch or proximal great vessel atherosclerosis or stenosis is evident. However, there is an abnormal undulating appearance of the anterior contour of the left common carotid artery in the neck (series 9, image 35 and series 105, image 23) which abates just proximal to the  bifurcation. This is associated with abnormal linear decreased time-of-flight signal in the vessel on series 4, image 83. The left carotid bifurcation appears normally patent and the cervical left ICA appears normal. Negative visible left anterior circulation. Contralateral right CCA, right carotid bifurcation and cervical right ICA appear within normal limits. Negative visible right anterior circulation. The proximal subclavian arteries and vertebral artery origins appear patent without stenosis. Tortuous V1 segments. No cervical vertebral artery irregularity or stenosis. Negative visible posterior circulation. IMPRESSION: 1. 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 The left carotid bifurcation and cervical left ICA seem unaffected. Recommend follow-up CTA neck with IV contrast to confirm. 2. Otherwise negative neck MRA. No cervical right carotid or vertebral artery abnormality identified. Electronically Signed: By: Genevie Ann M.D. On: 08/22/2019 14:53   MR BRAIN WO CONTRAST  Result Date: 08/22/2019 CLINICAL DATA:  68 year old male with abnormal speech, stroke symptoms. EXAM: MRI HEAD WITHOUT CONTRAST TECHNIQUE: Multiplanar, multiecho pulse sequences of the brain and surrounding structures were obtained without intravenous contrast. COMPARISON:  Head CT earlier today. FINDINGS: Brain: No restricted diffusion or evidence of acute infarction. No midline shift, mass effect, evidence of mass lesion, ventriculomegaly, extra-axial collection or acute intracranial hemorrhage. Cervicomedullary junction and pituitary are within normal limits. A small intraventricular adhesion is suspected near the right frontal horn (series  7, image 15, normal variant). There is abnormal T2 and FLAIR hyperintensity in the left cerebellar peduncle on series 7 image 7 where diffusion appears facilitated (series 4, image 14). The abnormality is about 6-7 millimeters. The trace  diffusion image does appear mildly abnormal here. T1 signal is also mildly decreased. No associated hemorrhage. Elsewhere gray and white matter signal appears within normal limits for age, including in the brainstem. No cortical encephalomalacia or chronic cerebral blood products identified. Cerebral volume appears within normal limits for age. Vascular: Major intracranial vascular flow voids are preserved. Skull and upper cervical spine: Negative visible cervical spine. Visualized bone marrow signal is within normal limits. Sinuses/Orbits: Negative orbits. Paranasal sinuses and mastoids are stable and well pneumatized. Other: Visible internal auditory structures appear normal. Scalp and face soft tissues appear negative. IMPRESSION: No acute infarct identified, but there is a solitary 7 mm area of nonspecific signal abnormality in the left cerebellar peduncle. Elsewhere the brain appears within normal limits for age. Follow-up post-contrast imaging is recommended to exclude neoplasm. But otherwise this resembles a nonspecific area of gliosis, with no associated mass effect, hemosiderin or mineralization. No associated brainstem or cerebellar abnormality. Electronically Signed   By: Genevie Ann M.D.   On: 08/22/2019 14:46   ECHOCARDIOGRAM COMPLETE  Result Date: 08/23/2019   ECHOCARDIOGRAM REPORT   Patient Name:   Samuel Barr Date of Exam: 08/23/2019 Medical Rec #:  [id number]       Height:       70.0 in Accession #:    [id number]      Weight:       181.0 lb Date of Birth:  Jan 06, 1951       BSA:          2.00 m Patient Age:    30 years        BP:           118/73 mmHg Patient Gender: M               HR:           69 bpm. Exam Location:  Inpatient Procedure: 2D Echo, Color Doppler and Cardiac Doppler Indications:    TIA 435.9  History:        Patient has no prior history of Echocardiogram examinations.                 TIA; Risk Factors:Former Smoker.  Sonographer:    Paulita Fujita RDCS Referring Phys: [place]  1. Left ventricular ejection fraction, by visual estimation, is 60 to 65%. The left ventricle has normal function. There is no left ventricular hypertrophy.  2. The left ventricle has no regional wall motion abnormalities.  3. Global right ventricle has normal systolic function.The right ventricular size is normal. No increase in right ventricular wall thickness.  4. Left atrial size was normal.  5. Right atrial size was normal.  6. The mitral valve is normal in structure. No evidence of mitral valve regurgitation. No evidence of mitral stenosis.  7. The tricuspid valve is normal in structure.  8. The aortic valve is normal in structure. Aortic valve regurgitation is not visualized. Mild aortic valve sclerosis without stenosis.  9. The pulmonic valve was normal in structure. Pulmonic valve regurgitation is not visualized. 10. Normal pulmonary artery systolic pressure. 11. The inferior vena cava is normal in size with greater than 50% respiratory variability, suggesting right atrial pressure of 3 mmHg. FINDINGS  Left Ventricle: Left  ventricular ejection fraction, by visual estimation, is 60 to 65%. The left ventricle has normal function. The left ventricle has no regional wall motion abnormalities. There is no left ventricular hypertrophy. Left ventricular diastolic parameters were normal. Normal left atrial pressure. Right Ventricle: The right ventricular size is normal. No increase in right ventricular wall thickness. Global RV systolic function is has normal systolic function. The tricuspid regurgitant velocity is 1.92 m/s, and with an assumed right atrial pressure  of 3 mmHg, the estimated right ventricular systolic pressure is normal at 17.7 mmHg. Left Atrium: Left atrial size was normal in size. Right Atrium: Right atrial size was normal in size Pericardium: There is no evidence of pericardial effusion. Mitral Valve: The mitral valve is normal in structure. No evidence of  mitral valve regurgitation. No evidence of mitral valve stenosis by observation. Tricuspid Valve: The tricuspid valve is normal in structure. Tricuspid valve regurgitation is trivial. Aortic Valve: The aortic valve is normal in structure. Aortic valve regurgitation is not visualized. Mild aortic valve sclerosis is present, with no evidence of aortic valve stenosis. Pulmonic Valve: The pulmonic valve was normal in structure. Pulmonic valve regurgitation is not visualized. Pulmonic regurgitation is not visualized. Aorta: The aortic root, ascending aorta and aortic arch are all structurally normal, with no evidence of dilitation or obstruction. Venous: The inferior vena cava is normal in size with greater than 50% respiratory variability, suggesting right atrial pressure of 3 mmHg. IAS/Shunts: No atrial level shunt detected by color flow Doppler. There is no evidence of a patent foramen ovale. No ventricular septal defect is seen or detected. There is no evidence of an atrial septal defect.  LEFT VENTRICLE PLAX 2D LVIDd:         4.89 cm  Diastology LVIDs:         3.58 cm  LV e' lateral:   10.10 cm/s LV PW:         0.66 cm  LV E/e' lateral: 5.4 LV IVS:        0.67 cm  LV e' medial:    6.96 cm/s LVOT diam:     1.80 cm  LV E/e' medial:  7.8 LV SV:         59 ml LV SV Index:   28.93 LVOT Area:     2.54 cm  RIGHT VENTRICLE RV S prime:     10.20 cm/s TAPSE (M-mode): 1.9 cm LEFT ATRIUM             Index       RIGHT ATRIUM           Index LA diam:        3.00 cm 1.50 cm/m  RA Area:     10.60 cm LA Vol (A2C):   37.7 ml 18.84 ml/m RA Volume:   20.40 ml  10.20 ml/m LA Vol (A4C):   32.3 ml 16.14 ml/m LA Biplane Vol: 35.4 ml 17.69 ml/m  AORTIC VALVE LVOT Vmax:   97.90 cm/s LVOT Vmean:  58.000 cm/s LVOT VTI:    0.204 m  AORTA Ao Root diam: 3.20 cm MITRAL VALVE                        TRICUSPID VALVE MV Area (PHT): 3.85 cm             TR Peak grad:   14.7 mmHg MV PHT:        57.13 msec  TR Vmax:        192.00 cm/s MV  Decel Time: 197 msec MV E velocity: 54.40 cm/s 103 cm/s  SHUNTS MV A velocity: 50.60 cm/s 70.3 cm/s Systemic VTI:  0.20 m MV E/A ratio:  1.08       1.5       Systemic Diam: 1.80 cm  Mihai Croitoru MD Electronically signed by Sanda Klein MD Signature Date/Time: 08/23/2019/11:36:53 AM    Final       Discharge Exam: Vitals:   08/23/19 1404 08/23/19 1419  BP: 127/68 115/67  Pulse: 63 71  Resp: 14 16  Temp:    SpO2: 95%    Vitals:   08/23/19 1331 08/23/19 1349 08/23/19 1404 08/23/19 1419  BP: 128/72 126/66 127/68 115/67  Pulse: (!) 59 62 63 71  Resp: 13 14 14 16   Temp:      TempSrc:      SpO2: 97% 98% 95%   Weight:      Height:        General: Pt is alert, awake, not in acute distress Cardiovascular: RRR, S1/S2 +, no rubs, no gallops Respiratory: CTA bilaterally, no wheezing, no rhonchi Abdominal: Soft, NT, ND, bowel sounds + Extremities: no edema, no cyanosis    The results of significant diagnostics from this hospitalization (including imaging, microbiology, ancillary and laboratory) are listed below for reference.     Microbiology: Recent Results (from the past 240 hour(s))  SARS CORONAVIRUS 2 (TAT 6-24 HRS) Nasopharyngeal Nasopharyngeal Swab     Status: None   Collection Time: 08/22/19  5:45 PM   Specimen: Nasopharyngeal Swab  Result Value Ref Range Status   SARS Coronavirus 2 NEGATIVE NEGATIVE Final    Comment: (NOTE) SARS-CoV-2 target nucleic acids are NOT DETECTED. The SARS-CoV-2 RNA is generally detectable in upper and lower respiratory specimens during the acute phase of infection. Negative results do not preclude SARS-CoV-2 infection, do not rule out co-infections with other pathogens, and should not be used as the sole basis for treatment or other patient management decisions. Negative results must be combined with clinical observations, patient history, and epidemiological information. The expected result is Negative. Fact Sheet for  Patients: SugarRoll.be Fact Sheet for Healthcare Providers: https://www.woods-mathews.com/ This test is not yet approved or cleared by the [place] FDA and  has been authorized for detection and/or diagnosis of SARS-CoV-2 by FDA under an Emergency Use Authorization (EUA). This EUA will remain  in effect (meaning this test can be used) for the duration of the COVID-19 declaration under Section 56 4(b)(1) of the Act, 21 U.S.C. section 360bbb-3(b)(1), unless the authorization is terminated or revoked sooner. Performed at [facility], [place] [address] [place],  [address]      Labs: BNP (last 3 results) No results for input(s): BNP in the last 8760 hours. Basic Metabolic Panel: Recent Labs  Lab 08/22/19 1223  NA 138  K 4.3  CL 101  CO2 27  GLUCOSE 274*  BUN 12  CREATININE 0.96  CALCIUM 9.3   Liver Function Tests: Recent Labs  Lab 08/22/19 1223  AST 21  ALT 27  ALKPHOS 51  BILITOT 0.6  PROT 7.0  ALBUMIN 4.0   No results for input(s): LIPASE, AMYLASE in the last 168 hours. No results for input(s): AMMONIA in the last 168 hours. CBC: Recent Labs  Lab 08/22/19 1223  WBC 3.7*  NEUTROABS 2.4  HGB 14.2  HCT 41.5  MCV 94.3  PLT 252   Cardiac Enzymes:  No results for input(s): CKTOTAL, CKMB, CKMBINDEX, TROPONINI in the last 168 hours. BNP: Invalid input(s): POCBNP CBG: Recent Labs  Lab 08/22/19 2148 08/22/19 2348 08/23/19 0613 08/23/19 1304  GLUCAP 239* 237* 180* 149*   D-Dimer No results for input(s): DDIMER in the last 72 hours. Hgb A1c No results for input(s): HGBA1C in the last 72 hours. Lipid Profile No results for input(s): CHOL, HDL, LDLCALC, TRIG, CHOLHDL, LDLDIRECT in the last 72 hours. Thyroid function studies No results for input(s): TSH, T4TOTAL, T3FREE, THYROIDAB in the last 72 hours.  Invalid input(s): FREET3 Anemia work up No results for input(s): VITAMINB12, FOLATE, FERRITIN,  TIBC, IRON, RETICCTPCT in the last 72 hours. Urinalysis    Component Value Date/Time   COLORURINE STRAW (A) 08/22/2019 1142   APPEARANCEUR CLEAR 08/22/2019 1142   LABSPEC 1.002 (L) 08/22/2019 1142   PHURINE 6.0 08/22/2019 1142   GLUCOSEU >=500 (A) 08/22/2019 1142   HGBUR NEGATIVE 08/22/2019 1142   BILIRUBINUR NEGATIVE 08/22/2019 1142   KETONESUR NEGATIVE 08/22/2019 1142   PROTEINUR NEGATIVE 08/22/2019 1142   NITRITE NEGATIVE 08/22/2019 1142   LEUKOCYTESUR NEGATIVE 08/22/2019 1142   Sepsis Labs Invalid input(s): PROCALCITONIN,  WBC,  LACTICIDVEN Microbiology Recent Results (from the past 240 hour(s))  SARS CORONAVIRUS 2 (TAT 6-24 HRS) Nasopharyngeal Nasopharyngeal Swab     Status: None   Collection Time: 08/22/19  5:45 PM   Specimen: Nasopharyngeal Swab  Result Value Ref Range Status   SARS Coronavirus 2 NEGATIVE NEGATIVE Final    Comment: (NOTE) SARS-CoV-2 target nucleic acids are NOT DETECTED. The SARS-CoV-2 RNA is generally detectable in upper and lower respiratory specimens during the acute phase of infection. Negative results do not preclude SARS-CoV-2 infection, do not rule out co-infections with other pathogens, and should not be used as the sole basis for treatment or other patient management decisions. Negative results must be combined with clinical observations, patient history, and epidemiological information. The expected result is Negative. Fact Sheet for Patients: SugarRoll.be Fact Sheet for Healthcare Providers: https://www.woods-mathews.com/ This test is not yet approved or cleared by the [place] FDA and  has been authorized for detection and/or diagnosis of SARS-CoV-2 by FDA under an Emergency Use Authorization (EUA). This EUA will remain  in effect (meaning this test can be used) for the duration of the COVID-19 declaration under Section 56 4(b)(1) of the Act, 21 U.S.C. section 360bbb-3(b)(1), unless the  authorization is terminated or revoked sooner. Performed at [facility], [place] [address] [place], [place] [address]      Time coordinating discharge: Over 30 minutes  SIGNED:   Darliss Cheney, MD  [facility] 08/23/2019, 3:19 PM  If 7PM-7AM, please contact night-coverage [email or web address] Password [email or web address]

## 2019-08-23 NOTE — Plan of Care (Signed)
Patient is progressing towards care plan goals. 

## 2019-08-27 ENCOUNTER — Other Ambulatory Visit (HOSPITAL_COMMUNITY): Payer: Self-pay | Admitting: Interventional Radiology

## 2019-08-27 DIAGNOSIS — I7771 Dissection of carotid artery: Secondary | ICD-10-CM

## 2019-08-29 DIAGNOSIS — G459 Transient cerebral ischemic attack, unspecified: Secondary | ICD-10-CM | POA: Diagnosis not present

## 2019-08-29 DIAGNOSIS — I10 Essential (primary) hypertension: Secondary | ICD-10-CM | POA: Diagnosis not present

## 2019-08-29 DIAGNOSIS — I6529 Occlusion and stenosis of unspecified carotid artery: Secondary | ICD-10-CM | POA: Diagnosis not present

## 2019-08-29 DIAGNOSIS — I720 Aneurysm of carotid artery: Secondary | ICD-10-CM | POA: Diagnosis not present

## 2019-08-29 DIAGNOSIS — Z6826 Body mass index (BMI) 26.0-26.9, adult: Secondary | ICD-10-CM | POA: Diagnosis not present

## 2019-08-30 DIAGNOSIS — I10 Essential (primary) hypertension: Secondary | ICD-10-CM | POA: Diagnosis not present

## 2019-08-30 DIAGNOSIS — E063 Autoimmune thyroiditis: Secondary | ICD-10-CM | POA: Diagnosis not present

## 2019-08-30 DIAGNOSIS — E7849 Other hyperlipidemia: Secondary | ICD-10-CM | POA: Diagnosis not present

## 2019-08-30 DIAGNOSIS — E1140 Type 2 diabetes mellitus with diabetic neuropathy, unspecified: Secondary | ICD-10-CM | POA: Diagnosis not present

## 2019-09-06 ENCOUNTER — Telehealth (HOSPITAL_COMMUNITY): Payer: Self-pay

## 2019-09-06 NOTE — Telephone Encounter (Signed)
Pt called to confirm appt for 09/07/19. Confirmed and gave pt directions to radiology. AW

## 2019-09-07 ENCOUNTER — Other Ambulatory Visit: Payer: Self-pay

## 2019-09-07 ENCOUNTER — Ambulatory Visit (HOSPITAL_COMMUNITY)
Admission: RE | Admit: 2019-09-07 | Discharge: 2019-09-07 | Disposition: A | Discharge status: Home / Self Care | Payer: Medicare HMO | Source: Ambulatory Visit | Attending: Interventional Radiology | Admitting: Interventional Radiology

## 2019-09-07 DIAGNOSIS — I7771 Dissection of carotid artery: Secondary | ICD-10-CM

## 2019-09-07 NOTE — Progress Notes (Signed)
Chief Complaint: Patient was seen in consultation today for left CCA pseudoaneurysms with associated stenosis.  Referring Physician(s): Denton Brick, Ejiroghene E  Supervising Physician: Luanne Bras  Patient Status: [facility] - Out-pt  History of Present Illness: Samuel Barr is a 69 y.o. male with a past medical history as below, with pertinent past medical history including diabetes mellitus and tonsillar cancer s/p resection and radiation 2009. He is known to [facility] and has been followed by Dr. Estanislado Pandy since 07/2019. He first presented to our department at the request of Dr. Denton Brick for management of an abnormal left CCA seen on MR/CTA 08/22/2019 during TIA work-up. He underwent an image-guided diagnostic cerebral arteriogram 08/23/2019 by Dr. 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 He was discharged home 08/23/2019 in stable condition.  Diagnostic cerebral arteriogram 08/23/2019: 1. Multiple focal outpouchings arising from the medial aspect of the left common carotid artery as described. 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 2. Associated 50% stenosis at its maximum.  Patient presents today for follow-up to discuss findings of recent diagnostic cerebral arteriogram 08/23/2019.  Patient awake and alert sitting in chair. No complaints. Accompanied by wife.  Currently taking Plavix 75 mg once daily.   Past Medical History:  Diagnosis Date  . Cancer [facility]) 2008    Past Surgical History:  Procedure Laterality Date  . EYE SURGERY    . HERNIA REPAIR    . IR ANGIO INTRA EXTRACRAN SEL COM CAROTID INNOMINATE BILAT MOD SED  08/23/2019  . IR ANGIO VERTEBRAL SEL VERTEBRAL BILAT MOD SED  08/23/2019  . TONSILLECTOMY Left    cancer    Allergies: Patient has no known allergies.  Medications: Prior to Admission medications   Medication Sig Start Date End Date Taking? Authorizing Provider   amLODipine (NORVASC) 2.5 MG tablet Take 1 tablet (2.5 mg total) by mouth daily. 08/23/19 09/22/19  Darliss Cheney, MD  aspirin 81 MG chewable tablet Chew 81 mg by mouth daily.    [provider]  atorvastatin (LIPITOR) 40 MG tablet Take 1 tablet (40 mg total) by mouth daily at 6 PM. 08/23/19 09/22/19  Darliss Cheney, MD  clopidogrel (PLAVIX) 75 MG tablet Take 1 tablet (75 mg total) by mouth daily. 08/24/19 11/22/19  Darliss Cheney, MD  diazepam (VALIUM) 5 MG tablet Take 5-10 mg by mouth at bedtime as needed. 07/23/19   [provider]  LANTUS SOLOSTAR 100 UNIT/ML Solostar Pen Inject 50-60 Units into the skin at bedtime. 07/30/19   [provider]  levothyroxine (SYNTHROID, LEVOTHROID) 25 MCG tablet Take 25 mcg by mouth daily before breakfast.    [provider]  metFORMIN (GLUCOPHAGE) 500 MG tablet Take 500 mg by mouth 2 (two) times daily with a meal.    [provider]     Family History  Problem Relation Age of Onset  . Diabetes Mother   . Heart attack Father     Social History   Socioeconomic History  . Marital status: Married    Spouse name: Not on file  . Number of children: Not on file  . Years of education: Not on file  . Highest education level: Not on file  Occupational History  . Not on file  Tobacco Use  . Smoking status: Former Smoker    Quit date: 09/08/1979    Years since quitting: 40.0  . Smokeless tobacco: Never Used  Substance and Sexual Activity  . Alcohol use: Not Currently  Comment: occasional  . Drug use: Yes    Comment: occas  . Sexual activity: Not on file  Other Topics Concern  . Not on file  Social History Narrative  . Not on file   Social Determinants of Health   Financial Resource Strain:   . Difficulty of Paying Living Expenses: Not on file  Food Insecurity:   . Worried About Charity fundraiser in the Last Year: Not on file  . Ran Out of Food in the Last Year: Not on file  Transportation Needs:   .  Lack of Transportation (Medical): Not on file  . Lack of Transportation (Non-Medical): Not on file  Physical Activity:   . Days of Exercise per Week: Not on file  . Minutes of Exercise per Session: Not on file  Stress:   . Feeling of Stress : Not on file  Social Connections:   . Frequency of Communication with Friends and Family: Not on file  . Frequency of Social Gatherings with Friends and Family: Not on file  . Attends Religious Services: Not on file  . Active Member of Clubs or Organizations: Not on file  . Attends Archivist Meetings: Not on file  . Marital Status: Not on file     Review of Systems: A 12 point ROS discussed and pertinent positives are indicated in the HPI above.  All other systems are negative.  Review of Systems  Unable to perform ROS: Other    Vital Signs: There were no vitals taken for this visit.  Physical Exam Constitutional:      General: He is not in acute distress.    Appearance: Normal appearance.  Pulmonary:     Effort: Pulmonary effort is normal. No respiratory distress.  Skin:    General: Skin is warm and dry.  Neurological:     Mental Status: He is alert and oriented to person, place, and time.      Imaging: CT HEAD WO CONTRAST  Result Date: 08/22/2019 CLINICAL DATA:  Speech difficulty. EXAM: CT HEAD WITHOUT CONTRAST TECHNIQUE: Contiguous axial images were obtained from the base of the skull through the vertex without intravenous contrast. COMPARISON:  05/21/2013 FINDINGS: Brain: There is no evidence of acute infarct, intracranial hemorrhage, mass, midline shift, or extra-axial fluid collection. The ventricles and sulci are normal. Bilateral globus pallidus mineralization has slightly progressed. Vascular: Calcified atherosclerosis at the skull base. No hyperdense vessel. Skull: No fracture or focal osseous lesion. Sinuses/Orbits: Visualized paranasal sinuses and mastoid air cells are clear. Orbits are unremarkable. Other: None.  IMPRESSION: No evidence of acute intracranial abnormality. Electronically Signed   By: Logan Bores M.D.   On: 08/22/2019 12:33   CT Angio Neck W and/or Wo Contrast  Result Date: 08/22/2019 CLINICAL DATA:  69 year old male with transient abnormal speech today. Abnormal appearance of the left CCA on neck MRA today. Reportedly there is a history of treated head and neck cancer. Normal intracranial MRA today. EXAM: CT ANGIOGRAPHY NECK TECHNIQUE: Multidetector CT imaging of the neck was performed using the standard protocol during bolus administration of intravenous contrast. Multiplanar CT image reconstructions and MIPs were obtained to evaluate the vascular anatomy. Carotid stenosis measurements (when applicable) are obtained utilizing NASCET criteria, using the distal internal carotid diameter as the denominator. CONTRAST:  145mL OMNIPAQUE IOHEXOL 350 MG/ML SOLN COMPARISON:  Head and neck MRA earlier today. FINDINGS: Skeleton: Absent maxillary and posterior mandible dentition. Mild for age cervical spine degeneration. No acute or suspicious osseous lesion. There  is T1 spina bifida occulta, normal variant. Upper chest: Negative upper lungs. No superior mediastinal lymphadenopathy. Other neck: Diminutive or absent left thyroid lobe. No surgical clips in the neck. But the left submandibular gland is absent. Laryngeal and pharyngeal soft tissue contours are within normal limits. Negative parapharyngeal, retropharyngeal, sublingual spaces. The left parotid gland is partially atrophied. Right submandibular and parotid glands are within normal limits. There is an obscuration of some of the left lateral neck soft tissue planes compatible with prior radiation therapy. The left SCM is present. The left IJ is present but diminutive. Aortic arch: 3 vessel arch configuration. Mild for age arch atherosclerosis. Right carotid system: Mild brachiocephalic artery soft and calcified plaque without stenosis. Negative right CCA  origin. Minimal soft plaque in the right CCA proximal to the bifurcation without stenosis. Mild soft and calcified plaque at the medial right ICA origin and bulb without stenosis. Visible right ICA siphon is patent with mild calcified plaque. Left carotid system: Soft and calcified plaque at the left CCA origin without stenosis. Bulky and low-density soft plaque in the medial and ventral left CCA at the level of the thyroid on series 6, image 52. Immediately beyond this section the medial and anterior vessel wall is highly abnormal with a multifocal saccular and undulating appearance (series 8, image 124) as seen on the MRA today. This is along a segment of about 3.5 centimeters of the vessel. There are areas on the axial images which suggest an intimal flap (series 4, image 57) but otherwise the appearance is not typical of dissection. Multiple large penetrating ulcers, with contained pseudoaneurysms is another consideration. The largest discrete lesion would be about 10 millimeters in length by 4-5 millimeters with. Regardless, the abnormality does result in about 50% stenosis of the left CCA. The left carotid bifurcation is patent with a more normal appearance. There is relatively mild and mostly calcified plaque at the left ICA origin. The left ICA is then patent to the skull base without additional plaque or abnormality. Visible left ICA siphon is patent with mild calcified plaque. Vertebral arteries: Calcified plaque in the proximal right subclavian artery abutting the right vertebral artery origin but without associated stenosis (series 7, image 133). The right vertebral appears mildly dominant and is patent to the vertebrobasilar junction without stenosis. Negative visible basilar artery. Soft and calcified plaque in the proximal left subclavian artery without stenosis. Normal left vertebral artery origin. Tortuous left V1 segment. Patent left vertebral artery to the vertebrobasilar junction without  stenosis. Patent PICA origins. Review of the MIP images confirms the above findings IMPRESSION: 1. Abnormal left common carotid artery which along a segment of about 3.5 cm proximal to the bifurcation has an appearance of multiple saccular lesions which might represent a combination of dissection, penetrating ulcers, pseudoaneurysms. Immediately upstream there is bulky low-density plaque within the vessel. And in the abnormal segment subsequent stenosis of about 50% occurs. The appearance normalizes proximal to the carotid bifurcation where there is only mild atherosclerosis and no other left ICA stenosis through the visible siphon. 2. Fairly mild for age atherosclerosis elsewhere in the neck and at the visible skull base. Note also MRA of the head and neck was performed earlier today. 3. Evidence of prior left neck radiation therapy. No neck mass or lymphadenopathy identified. The left submandibular gland may be atrophied or could have been resected. Study discussed by telephone with Dr. Stark Jock in the ED on 08/22/2019 at 16:20 . Electronically Signed   By: Lemmie Evens  [place] Crane M.D.   On: 08/22/2019 16:22   MR ANGIO HEAD WO CONTRAST  Result Date: 08/22/2019 CLINICAL DATA:  69 year old male with abnormal speech, stroke symptoms. EXAM: MRA HEAD WITHOUT CONTRAST TECHNIQUE: Angiographic images of the Circle of Willis were obtained using MRA technique without intravenous contrast. COMPARISON:  Brain MRI and head CT earlier today. FINDINGS: Antegrade flow in the posterior circulation with mildly dominant distal right vertebral artery. No distal vertebral or vertebrobasilar junction stenosis. Patent PICA origins. Patent basilar artery without stenosis. Normal SCA and PCA origins. Posterior communicating arteries are diminutive or absent. Bilateral PCA branches are within normal limits. Antegrade flow in both ICA siphons. No siphon stenosis. Ectasia of the right ophthalmic artery origin is suspected. No ICA siphon aneurysm. Patent  carotid termini. Normal MCA and ACA origins. Anterior communicating artery diminutive or absent. Visible ACA branches are within normal limits. Bilateral MCA M1 segments and MCA bi/trifurcations are patent without stenosis. Visible MCA branches are within normal limits. IMPRESSION: Negative intracranial MRA. Electronically Signed   By: Genevie Ann M.D.   On: 08/22/2019 14:48   MR Angiogram Neck W or Wo Contrast  Addendum Date: 08/22/2019   ADDENDUM REPORT: 08/22/2019 15:20 ADDENDUM: Study discussed by telephone with Dr. Noemi Chapel on 08/22/2019 at 1457 hours. He advises the patient has had previously treated head and neck cancer. It is possible that the left common carotid artery finding is susceptibility artifact. CTA should confirm. Electronically Signed   By: Genevie Ann M.D.   On: 08/22/2019 15:20   Result Date: 08/22/2019 CLINICAL DATA:  69 year old male with abnormal speech. Stroke-like symptoms. EXAM: MRA NECK WITHOUT AND WITH CONTRAST TECHNIQUE: Multiplanar and multiecho pulse sequences of the neck were obtained without and with intravenous contrast. Angiographic images of the neck were obtained using MRA technique without and with intravenous contrast. CONTRAST:  72mL GADAVIST GADOBUTROL 1 MMOL/ML IV SOLN COMPARISON:  Brain MRI and intracranial MRA today reported separately. FINDINGS: Precontrast time-of-flight imaging demonstrates antegrade flow in the bilateral cervical carotid and vertebral arteries. The carotid bifurcations appear within normal limits. The right vertebral appears slightly dominant. Post-contrast neck MRA images reveal a 3 vessel arch configuration. No arch or proximal great vessel atherosclerosis or stenosis is evident. However, there is an abnormal undulating appearance of the anterior contour of the left common carotid artery in the neck (series 9, image 35 and series 105, image 23) which abates just proximal to the bifurcation. This is associated with abnormal linear decreased  time-of-flight signal in the vessel on series 4, image 83. The left carotid bifurcation appears normally patent and the cervical left ICA appears normal. Negative visible left anterior circulation. Contralateral right CCA, right carotid bifurcation and cervical right ICA appear within normal limits. Negative visible right anterior circulation. The proximal subclavian arteries and vertebral artery origins appear patent without stenosis. Tortuous V1 segments. No cervical vertebral artery irregularity or stenosis. Negative visible posterior circulation. IMPRESSION: 1. 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 The left carotid bifurcation and cervical left ICA seem unaffected. Recommend follow-up CTA neck with IV contrast to confirm. 2. Otherwise negative neck MRA. No cervical right carotid or vertebral artery abnormality identified. Electronically Signed: By: Genevie Ann M.D. On: 08/22/2019 14:53   MR BRAIN WO CONTRAST  Result Date: 08/22/2019 CLINICAL DATA:  69 year old male with abnormal speech, stroke symptoms. EXAM: MRI HEAD WITHOUT CONTRAST TECHNIQUE: Multiplanar, multiecho pulse sequences of the brain and surrounding structures were obtained without intravenous  contrast. COMPARISON:  Head CT earlier today. FINDINGS: Brain: No restricted diffusion or evidence of acute infarction. No midline shift, mass effect, evidence of mass lesion, ventriculomegaly, extra-axial collection or acute intracranial hemorrhage. Cervicomedullary junction and pituitary are within normal limits. A small intraventricular adhesion is suspected near the right frontal horn (series 7, image 15, normal variant). There is abnormal T2 and FLAIR hyperintensity in the left cerebellar peduncle on series 7 image 7 where diffusion appears facilitated (series 4, image 14). The abnormality is about 6-7 millimeters. The trace diffusion image does appear mildly abnormal here. T1 signal is also  mildly decreased. No associated hemorrhage. Elsewhere gray and white matter signal appears within normal limits for age, including in the brainstem. No cortical encephalomalacia or chronic cerebral blood products identified. Cerebral volume appears within normal limits for age. Vascular: Major intracranial vascular flow voids are preserved. Skull and upper cervical spine: Negative visible cervical spine. Visualized bone marrow signal is within normal limits. Sinuses/Orbits: Negative orbits. Paranasal sinuses and mastoids are stable and well pneumatized. Other: Visible internal auditory structures appear normal. Scalp and face soft tissues appear negative. IMPRESSION: No acute infarct identified, but there is a solitary 7 mm area of nonspecific signal abnormality in the left cerebellar peduncle. Elsewhere the brain appears within normal limits for age. Follow-up post-contrast imaging is recommended to exclude neoplasm. But otherwise this resembles a nonspecific area of gliosis, with no associated mass effect, hemosiderin or mineralization. No associated brainstem or cerebellar abnormality. Electronically Signed   By: Genevie Ann M.D.   On: 08/22/2019 14:46   ECHOCARDIOGRAM COMPLETE  Result Date: 08/23/2019   ECHOCARDIOGRAM REPORT   Patient Name:   Samuel Barr Date of Exam: 08/23/2019 Medical Rec #:  [id number]       Height:       70.0 in Accession #:    [id number]      Weight:       181.0 lb Date of Birth:  02-19-1951       BSA:          2.00 m Patient Age:    52 years        BP:           118/73 mmHg Patient Gender: M               HR:           69 bpm. Exam Location:  Inpatient Procedure: 2D Echo, Color Doppler and Cardiac Doppler Indications:    TIA 435.9  History:        Patient has no prior history of Echocardiogram examinations.                 TIA; Risk Factors:Former Smoker.  Sonographer:    Paulita Fujita RDCS Referring Phys: [place]  1. Left ventricular ejection fraction,  by visual estimation, is 60 to 65%. The left ventricle has normal function. There is no left ventricular hypertrophy.  2. The left ventricle has no regional wall motion abnormalities.  3. Global right ventricle has normal systolic function.The right ventricular size is normal. No increase in right ventricular wall thickness.  4. Left atrial size was normal.  5. Right atrial size was normal.  6. The mitral valve is normal in structure. No evidence of mitral valve regurgitation. No evidence of mitral stenosis.  7. The tricuspid valve is normal in structure.  8. The aortic valve is normal in structure. Aortic valve regurgitation is not visualized. Mild  aortic valve sclerosis without stenosis.  9. The pulmonic valve was normal in structure. Pulmonic valve regurgitation is not visualized. 10. Normal pulmonary artery systolic pressure. 11. The inferior vena cava is normal in size with greater than 50% respiratory variability, suggesting right atrial pressure of 3 mmHg. FINDINGS  Left Ventricle: Left ventricular ejection fraction, by visual estimation, is 60 to 65%. The left ventricle has normal function. The left ventricle has no regional wall motion abnormalities. There is no left ventricular hypertrophy. Left ventricular diastolic parameters were normal. Normal left atrial pressure. Right Ventricle: The right ventricular size is normal. No increase in right ventricular wall thickness. Global RV systolic function is has normal systolic function. The tricuspid regurgitant velocity is 1.92 m/s, and with an assumed right atrial pressure  of 3 mmHg, the estimated right ventricular systolic pressure is normal at 17.7 mmHg. Left Atrium: Left atrial size was normal in size. Right Atrium: Right atrial size was normal in size Pericardium: There is no evidence of pericardial effusion. Mitral Valve: The mitral valve is normal in structure. No evidence of mitral valve regurgitation. No evidence of mitral valve stenosis by  observation. Tricuspid Valve: The tricuspid valve is normal in structure. Tricuspid valve regurgitation is trivial. Aortic Valve: The aortic valve is normal in structure. Aortic valve regurgitation is not visualized. Mild aortic valve sclerosis is present, with no evidence of aortic valve stenosis. Pulmonic Valve: The pulmonic valve was normal in structure. Pulmonic valve regurgitation is not visualized. Pulmonic regurgitation is not visualized. Aorta: The aortic root, ascending aorta and aortic arch are all structurally normal, with no evidence of dilitation or obstruction. Venous: The inferior vena cava is normal in size with greater than 50% respiratory variability, suggesting right atrial pressure of 3 mmHg. IAS/Shunts: No atrial level shunt detected by color flow Doppler. There is no evidence of a patent foramen ovale. No ventricular septal defect is seen or detected. There is no evidence of an atrial septal defect.  LEFT VENTRICLE PLAX 2D LVIDd:         4.89 cm  Diastology LVIDs:         3.58 cm  LV e' lateral:   10.10 cm/s LV PW:         0.66 cm  LV E/e' lateral: 5.4 LV IVS:        0.67 cm  LV e' medial:    6.96 cm/s LVOT diam:     1.80 cm  LV E/e' medial:  7.8 LV SV:         59 ml LV SV Index:   28.93 LVOT Area:     2.54 cm  RIGHT VENTRICLE RV S prime:     10.20 cm/s TAPSE (M-mode): 1.9 cm LEFT ATRIUM             Index       RIGHT ATRIUM           Index LA diam:        3.00 cm 1.50 cm/m  RA Area:     10.60 cm LA Vol (A2C):   37.7 ml 18.84 ml/m RA Volume:   20.40 ml  10.20 ml/m LA Vol (A4C):   32.3 ml 16.14 ml/m LA Biplane Vol: 35.4 ml 17.69 ml/m  AORTIC VALVE LVOT Vmax:   97.90 cm/s LVOT Vmean:  58.000 cm/s LVOT VTI:    0.204 m  AORTA Ao Root diam: 3.20 cm MITRAL VALVE  TRICUSPID VALVE MV Area (PHT): 3.85 cm             TR Peak grad:   14.7 mmHg MV PHT:        57.13 msec           TR Vmax:        192.00 cm/s MV Decel Time: 197 msec MV E velocity: 54.40 cm/s 103 cm/s  SHUNTS MV A  velocity: 50.60 cm/s 70.3 cm/s Systemic VTI:  0.20 m MV E/A ratio:  1.08       1.5       Systemic Diam: 1.80 cm  Mihai Croitoru MD Electronically signed by Sanda Klein MD Signature Date/Time: 08/23/2019/11:36:53 AM    Final    IR ANGIO INTRA EXTRACRAN SEL COM CAROTID INNOMINATE BILAT MOD SED  Result Date: 08/25/2019 CLINICAL DATA:  History of left facial and mouth tingling. Workup with CT angiogram of the head and neck revealed abnormalities of the left common carotid artery proximally. EXAM: EXAM BILATERAL COMMON CAROTID AND INNOMINATE ANGIOGRAPHY COMPARISON:  CT scan of the head and neck of August 22, 2019. MEDICATIONS: Heparin 1000 units IV; no antibiotic was administered within 1 hour of the procedure. ANESTHESIA/SEDATION: Versed 1 mg IV; Fentanyl 50 mcg IV Moderate Sedation Time:  32 minutes The patient was continuously monitored during the procedure by the interventional radiology nurse under my direct supervision. CONTRAST:  Isovue 300 approximately 60 mL. FLUOROSCOPY TIME:  Fluoroscopy Time: 9 minutes 30 seconds (894 mGy). COMPLICATIONS: None immediate. TECHNIQUE: Informed written consent was obtained from the patient after a thorough discussion of the procedural risks, benefits and alternatives. All questions were addressed. Maximal Sterile Barrier Technique was utilized including caps, mask, sterile gowns, sterile gloves, sterile drape, hand hygiene and skin antiseptic. A timeout was performed prior to the initiation of the procedure. The right groin was prepped and draped in the usual sterile fashion. Thereafter using modified Seldinger technique, transfemoral access into the right common femoral artery was obtained without difficulty. Over a 0.035 inch guidewire, a 5 French Pinnacle sheath was inserted. Through this, and also over 0.035 inch guidewire, a 5 [place] JB 1 catheter was advanced to the aortic arch region and selectively positioned in the right common carotid artery, the right  vertebral artery, the left common carotid artery and the left vertebral artery. FINDINGS: The right common carotid arteriogram demonstrates the right external carotid artery at its origin to be mildly narrowed. Its branches opacify normally. The right internal carotid artery at the bulb also demonstrates minimal smooth atherosclerotic plaque along the posterior wall. No evidence of ulcerations or of intraluminal filling defects is seen. The right internal carotid artery ascends normally to the cranial skull base. The petrous, cavernous and the supraclinoid segments are widely patent. The right middle cerebral artery and the right anterior cerebral artery opacify into the capillary and venous phases. Predominance venous drainage is via the right transverse and sigmoid sinuses. The origin of the right vertebral artery is widely patent. The vessel opacifies normally to the cranial skull base. Wide patency is seen of the right vertebrobasilar junction and the right posterior-inferior cerebellar artery. The basilar artery, the posterior cerebral arteries, the superior cerebellar arteries and the anterior-inferior cerebellar arteries opacify into the capillary and venous phases. Unopacified blood is seen in the basilar artery from the contralateral vertebral artery. The venous phase demonstrates a non dominant left transverse sinus in its proximal 2/3. The left common carotid arteriogram demonstrates multiple saccular outpouching arising from the  medial aspect of the left common carotid artery in its distal 2/3. The outpouchings measure from proximal to distal 7.6 mm x 3.8 mm, 5 mm x 7 mm, and 10 mm x 3.5 mm. An additional smaller outpouching is seen the mid segment of the left common carotid artery. These are associated with mild-to-moderate stenosis most prominent along the most proximal aspect. The left external carotid artery and its major branches are widely patent. The left internal carotid artery at the bulb to the  cranial skull base demonstrates wide patency. The petrous, cavernous and the supraclinoid segments are widely patent. The left middle cerebral artery and the left anterior cerebral artery opacify into the capillary and venous phases. There is transient cross-filling via the anterior communicating artery of the right anterior cerebral A2 segment and distally. The origin of the left vertebral artery is widely patent. The vessel opacifies to the cranial skull base. Wide patency is seen of the left vertebrobasilar junction and the left posterior-inferior cerebellar artery. The basilar artery, the posterior cerebral arteries, the superior cerebellar arteries and the anterior-inferior cerebellar arteries opacify into the capillary and venous phases. Unopacified blood is seen in the basilar artery from the contralateral vertebral artery. IMPRESSION: Multiple focal outpouchings arising from the medial aspect of the left common carotid artery as described. 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 Associated 50% stenosis at its maximum. PLAN: Findings reviewed with the patient also the patient's referring hospitalist. The patient was advised to add Plavix to his aspirin regimen. The patient was also advised to return in consultation in a couple of weeks to further discuss management of the above angiographic findings. The patient expressed understanding and agreement with the above management plan. Electronically Signed   By: Luanne Bras M.D.   On: 08/23/2019 14:14   IR ANGIO VERTEBRAL SEL VERTEBRAL BILAT MOD SED  Result Date: 08/28/2019 CLINICAL DATA:  History of left facial and mouth tingling. Workup with CT angiogram of the head and neck revealed abnormalities of the left common carotid artery proximally. EXAM: EXAM BILATERAL COMMON CAROTID AND INNOMINATE ANGIOGRAPHY COMPARISON:  CT scan of the head and neck of August 22, 2019. MEDICATIONS: Heparin 1000 units  IV; no antibiotic was administered within 1 hour of the procedure. ANESTHESIA/SEDATION: Versed 1 mg IV; Fentanyl 50 mcg IV Moderate Sedation Time:  32 minutes The patient was continuously monitored during the procedure by the interventional radiology nurse under my direct supervision. CONTRAST:  Isovue 300 approximately 60 mL. FLUOROSCOPY TIME:  Fluoroscopy Time: 9 minutes 30 seconds (894 mGy). COMPLICATIONS: None immediate. TECHNIQUE: Informed written consent was obtained from the patient after a thorough discussion of the procedural risks, benefits and alternatives. All questions were addressed. Maximal Sterile Barrier Technique was utilized including caps, mask, sterile gowns, sterile gloves, sterile drape, hand hygiene and skin antiseptic. A timeout was performed prior to the initiation of the procedure. The right groin was prepped and draped in the usual sterile fashion. Thereafter using modified Seldinger technique, transfemoral access into the right common femoral artery was obtained without difficulty. Over a 0.035 inch guidewire, a 5 French Pinnacle sheath was inserted. Through this, and also over 0.035 inch guidewire, a 5 [place] JB 1 catheter was advanced to the aortic arch region and selectively positioned in the right common carotid artery, the right vertebral artery, the left common carotid artery and the left vertebral artery. FINDINGS: The right common carotid arteriogram demonstrates the right external carotid artery at its origin to be  mildly narrowed. Its branches opacify normally. The right internal carotid artery at the bulb also demonstrates minimal smooth atherosclerotic plaque along the posterior wall. No evidence of ulcerations or of intraluminal filling defects is seen. The right internal carotid artery ascends normally to the cranial skull base. The petrous, cavernous and the supraclinoid segments are widely patent. The right middle cerebral artery and the right anterior cerebral artery  opacify into the capillary and venous phases. Predominance venous drainage is via the right transverse and sigmoid sinuses. The origin of the right vertebral artery is widely patent. The vessel opacifies normally to the cranial skull base. Wide patency is seen of the right vertebrobasilar junction and the right posterior-inferior cerebellar artery. The basilar artery, the posterior cerebral arteries, the superior cerebellar arteries and the anterior-inferior cerebellar arteries opacify into the capillary and venous phases. Unopacified blood is seen in the basilar artery from the contralateral vertebral artery. The venous phase demonstrates a non dominant left transverse sinus in its proximal 2/3. The left common carotid arteriogram demonstrates multiple saccular outpouching arising from the medial aspect of the left common carotid artery in its distal 2/3. The outpouchings measure from proximal to distal 7.6 mm x 3.8 mm, 5 mm x 7 mm, and 10 mm x 3.5 mm. An additional smaller outpouching is seen the mid segment of the left common carotid artery. These are associated with mild-to-moderate stenosis most prominent along the most proximal aspect. The left external carotid artery and its major branches are widely patent. The left internal carotid artery at the bulb to the cranial skull base demonstrates wide patency. The petrous, cavernous and the supraclinoid segments are widely patent. The left middle cerebral artery and the left anterior cerebral artery opacify into the capillary and venous phases. There is transient cross-filling via the anterior communicating artery of the right anterior cerebral A2 segment and distally. The origin of the left vertebral artery is widely patent. The vessel opacifies to the cranial skull base. Wide patency is seen of the left vertebrobasilar junction and the left posterior-inferior cerebellar artery. The basilar artery, the posterior cerebral arteries, the superior cerebellar arteries  and the anterior-inferior cerebellar arteries opacify into the capillary and venous phases. Unopacified blood is seen in the basilar artery from the contralateral vertebral artery. IMPRESSION: Multiple focal outpouchings arising from the medial aspect of the left common carotid artery as described. 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 Associated 50% stenosis at its maximum. PLAN: Findings reviewed with the patient also the patient's referring hospitalist. The patient was advised to add Plavix to his aspirin regimen. The patient was also advised to return in consultation in a couple of weeks to further discuss management of the above angiographic findings. The patient expressed understanding and agreement with the above management plan. Electronically Signed   By: Luanne Bras M.D.   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 Explained that Dr. Estanislado Pandy is in an emergent code stroke procedure that required his immediate attention at this time. Because of this, he will not be able to meet with them, and they will be meeting with me instead. They convey understanding and agree to meet with me today. Discussed findings of recent diagnostic cerebral arteriogram 08/23/2019. Explained that there are two management options moving forward- either routine imaging scans to monitor for changes or with an endovascular embolization/revascularization  procedure. Explained procedure, including risks and benefits. Patient and wife have many questions that I answered to the best of my ability, however they are requesting to re-consult with Dr. Estanislado Pandy before making decision.  Plan for follow-up with additional consultation with Dr. Estanislado Pandy, first available. Informed patient that our schedulers will call him to set up this consult. Instructed patient to continue taking Plavix 75 mg once daily.  All questions answered and concerns addressed. Patient and wife convey understanding and agree with plan.  Thank you for this interesting consult.  I greatly enjoyed meeting Samuel Barr and look forward to participating in their care.  A copy of this report was sent to the requesting provider on this date.  Electronically Signed: Earley Abide, PA-C 09/07/2019, 9:19 AM   I spent a total of 40 Minutes in face to face in clinical consultation, greater than 50% of which was counseling/coordinating care for left CCA pseudoaneurysms with associated stenosis.

## 2019-09-10 ENCOUNTER — Other Ambulatory Visit (HOSPITAL_COMMUNITY): Payer: Self-pay | Admitting: Interventional Radiology

## 2019-09-10 ENCOUNTER — Telehealth (HOSPITAL_COMMUNITY): Payer: Self-pay

## 2019-09-10 DIAGNOSIS — I7771 Dissection of carotid artery: Secondary | ICD-10-CM

## 2019-09-10 NOTE — Telephone Encounter (Signed)
Called to reschedule consult, no answer, left vm. AW  

## 2019-09-11 ENCOUNTER — Other Ambulatory Visit: Payer: Self-pay

## 2019-09-11 ENCOUNTER — Ambulatory Visit (HOSPITAL_COMMUNITY)
Admission: RE | Admit: 2019-09-11 | Discharge: 2019-09-11 | Disposition: A | Discharge status: Home / Self Care | Payer: Medicare Other | Source: Ambulatory Visit | Attending: Interventional Radiology | Admitting: Interventional Radiology

## 2019-09-11 DIAGNOSIS — I7771 Dissection of carotid artery: Secondary | ICD-10-CM

## 2019-09-11 NOTE — Progress Notes (Signed)
Chief Complaint: Patient was seen in consultation today for left CCA pseudoaneurysms with associated stenosis.  Referring Physician(s): Denton Brick, Ejiroghene E  Supervising Physician: Luanne Bras  Patient Status: Samuel Barr - Out-pt  History of Present Illness: Samuel Barr is a 70 y.o. male with a past medical history as below, with pertinent past medical history including diabetes mellitus with associated peripheral neuropathy and tonsillar cancer s/p resection and radiation 2009. He is known to [facility] and has been followed by Dr. Estanislado Pandy since 07/2019. He first presented to our department at the request of Dr. Denton Brick for management of an abnormal left CCA seen on MR/CTA 08/22/2019 during TIA work-up. He underwent an image-guided diagnostic cerebral arteriogram 08/23/2019 by Dr. 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 He was discharged home 08/23/2019 in stable condition. He met with me for consultation 09/07/2019 to discuss management options, however Dr. Estanislado Pandy was unavailable to meet with him due to an emergent code stroke, and patient requested another consultation to discuss management options with Dr. Estanislado Pandy.  Diagnostic cerebral arteriogram 08/23/2019: 1. Multiple focal outpouchings arising from the medial aspect of the left common carotid artery as described. 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 2. Associated 50% stenosis at its maximum.  Patient presents today for follow-up to discuss findings of recent diagnostic cerebral arteriogram 08/23/2019.  Patient awake and alert sitting in chair. Accompanied by wife. Complains of intermittent numbness/tingling of fingers/toes. Denies headache, weakness, dizziness, vision changes, hearing changes, tinnitus, or speech difficulty.  Currently taking Plavix 75 mg once daily.   Past Medical History:  Diagnosis Date   Cancer [facility])  2008    Past Surgical History:  Procedure Laterality Date   EYE SURGERY     HERNIA REPAIR     IR ANGIO INTRA EXTRACRAN SEL COM CAROTID INNOMINATE BILAT MOD SED  08/23/2019   IR ANGIO VERTEBRAL SEL VERTEBRAL BILAT MOD SED  08/23/2019   TONSILLECTOMY Left    cancer    Allergies: Patient has no known allergies.  Medications: Prior to Admission medications   Medication Sig Start Date End Date Taking? Authorizing Provider  amLODipine (NORVASC) 2.5 MG tablet Take 1 tablet (2.5 mg total) by mouth daily. 08/23/19 09/22/19  Darliss Cheney, MD  aspirin 81 MG chewable tablet Chew 81 mg by mouth daily.    [provider]  atorvastatin (LIPITOR) 40 MG tablet Take 1 tablet (40 mg total) by mouth daily at 6 PM. 08/23/19 09/22/19  Darliss Cheney, MD  clopidogrel (PLAVIX) 75 MG tablet Take 1 tablet (75 mg total) by mouth daily. 08/24/19 11/22/19  Darliss Cheney, MD  diazepam (VALIUM) 5 MG tablet Take 5-10 mg by mouth at bedtime as needed. 07/23/19   [provider]  LANTUS SOLOSTAR 100 UNIT/ML Solostar Pen Inject 50-60 Units into the skin at bedtime. 07/30/19   [provider]  levothyroxine (SYNTHROID, LEVOTHROID) 25 MCG tablet Take 25 mcg by mouth daily before breakfast.    [provider]  metFORMIN (GLUCOPHAGE) 500 MG tablet Take 500 mg by mouth 2 (two) times daily with a meal.    [provider]     Family History  Problem Relation Age of Onset   Diabetes Mother    Heart attack Father     Social History   Socioeconomic History   Marital status: Married    Spouse name: Not on file   Number of children: Not on file   Years of education: Not on file  Highest education level: Not on file  Occupational History   Not on file  Tobacco Use   Smoking status: Former Smoker    Quit date: 09/08/1979    Years since quitting: 40.0   Smokeless tobacco: Never Used  Substance and Sexual Activity   Alcohol use: Not Currently    Comment:  occasional   Drug use: Yes    Comment: occas   Sexual activity: Not on file  Other Topics Concern   Not on file  Social History Narrative   Not on file   Social Determinants of Health   Financial Resource Strain:    Difficulty of Paying Living Expenses: Not on file  Food Insecurity:    Worried About [place] in the Last Year: Not on file   [employer] of Food in the Last Year: Not on file  Transportation Needs:    Lack of Transportation (Medical): Not on file   Lack of Transportation (Non-Medical): Not on file  Physical Activity:    Days of Exercise per Week: Not on file   Minutes of Exercise per Session: Not on file  Stress:    Feeling of Stress : Not on file  Social Connections:    Frequency of Communication with Friends and Family: Not on file   Frequency of Social Gatherings with Friends and Family: Not on file   Attends Religious Services: Not on file   Active Member of Clubs or Organizations: Not on file   Attends Archivist Meetings: Not on file   Marital Status: Not on file     Review of Systems: A 12 point ROS discussed and pertinent positives are indicated in the HPI above.  All other systems are negative.  Review of Systems  Constitutional: Negative for chills and fever.  HENT: Negative for hearing loss and tinnitus.   Respiratory: Negative for shortness of breath and wheezing.   Cardiovascular: Negative for chest pain and palpitations.  Neurological: Positive for numbness. Negative for dizziness, speech difficulty, weakness and headaches.  Psychiatric/Behavioral: Negative for behavioral problems and confusion.    Vital Signs: There were no vitals taken for this visit.  Physical Exam Constitutional:      General: He is not in acute distress.    Appearance: Normal appearance.  Pulmonary:     Effort: Pulmonary effort is normal. No respiratory distress.  Skin:    General: Skin is warm.  Neurological:     Mental  Status: He is alert and oriented to person, place, and time.  Psychiatric:        Mood and Affect: Mood normal.        Behavior: Behavior normal.      Imaging: CT HEAD WO CONTRAST  Result Date: 08/22/2019 CLINICAL DATA:  Speech difficulty. EXAM: CT HEAD WITHOUT CONTRAST TECHNIQUE: Contiguous axial images were obtained from the base of the skull through the vertex without intravenous contrast. COMPARISON:  05/21/2013 FINDINGS: Brain: There is no evidence of acute infarct, intracranial hemorrhage, mass, midline shift, or extra-axial fluid collection. The ventricles and sulci are normal. Bilateral globus pallidus mineralization has slightly progressed. Vascular: Calcified atherosclerosis at the skull base. No hyperdense vessel. Skull: No fracture or focal osseous lesion. Sinuses/Orbits: Visualized paranasal sinuses and mastoid air cells are clear. Orbits are unremarkable. Other: None. IMPRESSION: No evidence of acute intracranial abnormality. Electronically Signed   By: Logan Bores M.D.   On: 08/22/2019 12:33   CT Angio Neck W and/or Wo Contrast  Result Date: 08/22/2019  CLINICAL DATA:  69 year old male with transient abnormal speech today. Abnormal appearance of the left CCA on neck MRA today. Reportedly there is a history of treated head and neck cancer. Normal intracranial MRA today. EXAM: CT ANGIOGRAPHY NECK TECHNIQUE: Multidetector CT imaging of the neck was performed using the standard protocol during bolus administration of intravenous contrast. Multiplanar CT image reconstructions and MIPs were obtained to evaluate the vascular anatomy. Carotid stenosis measurements (when applicable) are obtained utilizing NASCET criteria, using the distal internal carotid diameter as the denominator. CONTRAST:  172m OMNIPAQUE IOHEXOL 350 MG/ML SOLN COMPARISON:  Head and neck MRA earlier today. FINDINGS: Skeleton: Absent maxillary and posterior mandible dentition. Mild for age cervical spine degeneration. No  acute or suspicious osseous lesion. There is T1 spina bifida occulta, normal variant. Upper chest: Negative upper lungs. No superior mediastinal lymphadenopathy. Other neck: Diminutive or absent left thyroid lobe. No surgical clips in the neck. But the left submandibular gland is absent. Laryngeal and pharyngeal soft tissue contours are within normal limits. Negative parapharyngeal, retropharyngeal, sublingual spaces. The left parotid gland is partially atrophied. Right submandibular and parotid glands are within normal limits. There is an obscuration of some of the left lateral neck soft tissue planes compatible with prior radiation therapy. The left SCM is present. The left IJ is present but diminutive. Aortic arch: 3 vessel arch configuration. Mild for age arch atherosclerosis. Right carotid system: Mild brachiocephalic artery soft and calcified plaque without stenosis. Negative right CCA origin. Minimal soft plaque in the right CCA proximal to the bifurcation without stenosis. Mild soft and calcified plaque at the medial right ICA origin and bulb without stenosis. Visible right ICA siphon is patent with mild calcified plaque. Left carotid system: Soft and calcified plaque at the left CCA origin without stenosis. Bulky and low-density soft plaque in the medial and ventral left CCA at the level of the thyroid on series 6, image 52. Immediately beyond this section the medial and anterior vessel wall is highly abnormal with a multifocal saccular and undulating appearance (series 8, image 124) as seen on the MRA today. This is along a segment of about 3.5 centimeters of the vessel. There are areas on the axial images which suggest an intimal flap (series 4, image 57) but otherwise the appearance is not typical of dissection. Multiple large penetrating ulcers, with contained pseudoaneurysms is another consideration. The largest discrete lesion would be about 10 millimeters in length by 4-5 millimeters with.  Regardless, the abnormality does result in about 50% stenosis of the left CCA. The left carotid bifurcation is patent with a more normal appearance. There is relatively mild and mostly calcified plaque at the left ICA origin. The left ICA is then patent to the skull base without additional plaque or abnormality. Visible left ICA siphon is patent with mild calcified plaque. Vertebral arteries: Calcified plaque in the proximal right subclavian artery abutting the right vertebral artery origin but without associated stenosis (series 7, image 133). The right vertebral appears mildly dominant and is patent to the vertebrobasilar junction without stenosis. Negative visible basilar artery. Soft and calcified plaque in the proximal left subclavian artery without stenosis. Normal left vertebral artery origin. Tortuous left V1 segment. Patent left vertebral artery to the vertebrobasilar junction without stenosis. Patent PICA origins. Review of the MIP images confirms the above findings IMPRESSION: 1. Abnormal left common carotid artery which along a segment of about 3.5 cm proximal to the bifurcation has an appearance of multiple saccular lesions which might represent a  combination of dissection, penetrating ulcers, pseudoaneurysms. Immediately upstream there is bulky low-density plaque within the vessel. And in the abnormal segment subsequent stenosis of about 50% occurs. The appearance normalizes proximal to the carotid bifurcation where there is only mild atherosclerosis and no other left ICA stenosis through the visible siphon. 2. Fairly mild for age atherosclerosis elsewhere in the neck and at the visible skull base. Note also MRA of the head and neck was performed earlier today. 3. Evidence of prior left neck radiation therapy. No neck mass or lymphadenopathy identified. The left submandibular gland may be atrophied or could have been resected. Study discussed by telephone with Dr. Stark Jock in the ED on 08/22/2019 at 16:20  . Electronically Signed   By: Genevie Ann M.D.   On: 08/22/2019 16:22   MR ANGIO HEAD WO CONTRAST  Result Date: 08/22/2019 CLINICAL DATA:  69 year old male with abnormal speech, stroke symptoms. EXAM: MRA HEAD WITHOUT CONTRAST TECHNIQUE: Angiographic images of the Circle of Willis were obtained using MRA technique without intravenous contrast. COMPARISON:  Brain MRI and head CT earlier today. FINDINGS: Antegrade flow in the posterior circulation with mildly dominant distal right vertebral artery. No distal vertebral or vertebrobasilar junction stenosis. Patent PICA origins. Patent basilar artery without stenosis. Normal SCA and PCA origins. Posterior communicating arteries are diminutive or absent. Bilateral PCA branches are within normal limits. Antegrade flow in both ICA siphons. No siphon stenosis. Ectasia of the right ophthalmic artery origin is suspected. No ICA siphon aneurysm. Patent carotid termini. Normal MCA and ACA origins. Anterior communicating artery diminutive or absent. Visible ACA branches are within normal limits. Bilateral MCA M1 segments and MCA bi/trifurcations are patent without stenosis. Visible MCA branches are within normal limits. IMPRESSION: Negative intracranial MRA. Electronically Signed   By: Genevie Ann M.D.   On: 08/22/2019 14:48   MR Angiogram Neck W or Wo Contrast  Addendum Date: 08/22/2019   ADDENDUM REPORT: 08/22/2019 15:20 ADDENDUM: Study discussed by telephone with Dr. Noemi Chapel on 08/22/2019 at 1457 hours. He advises the patient has had previously treated head and neck cancer. It is possible that the left common carotid artery finding is susceptibility artifact. CTA should confirm. Electronically Signed   By: Genevie Ann M.D.   On: 08/22/2019 15:20   Result Date: 08/22/2019 CLINICAL DATA:  69 year old male with abnormal speech. Stroke-like symptoms. EXAM: MRA NECK WITHOUT AND WITH CONTRAST TECHNIQUE: Multiplanar and multiecho pulse sequences of the neck were obtained  without and with intravenous contrast. Angiographic images of the neck were obtained using MRA technique without and with intravenous contrast. CONTRAST:  74m GADAVIST GADOBUTROL 1 MMOL/ML IV SOLN COMPARISON:  Brain MRI and intracranial MRA today reported separately. FINDINGS: Precontrast time-of-flight imaging demonstrates antegrade flow in the bilateral cervical carotid and vertebral arteries. The carotid bifurcations appear within normal limits. The right vertebral appears slightly dominant. Post-contrast neck MRA images reveal a 3 vessel arch configuration. No arch or proximal great vessel atherosclerosis or stenosis is evident. However, there is an abnormal undulating appearance of the anterior contour of the left common carotid artery in the neck (series 9, image 35 and series 105, image 23) which abates just proximal to the bifurcation. This is associated with abnormal linear decreased time-of-flight signal in the vessel on series 4, image 83. The left carotid bifurcation appears normally patent and the cervical left ICA appears normal. Negative visible left anterior circulation. Contralateral right CCA, right carotid bifurcation and cervical right ICA appear within normal limits. Negative visible right anterior  circulation. The proximal subclavian arteries and vertebral artery origins appear patent without stenosis. Tortuous V1 segments. No cervical vertebral artery irregularity or stenosis. Negative visible posterior circulation. IMPRESSION: 1. 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 The left carotid bifurcation and cervical left ICA seem unaffected. Recommend follow-up CTA neck with IV contrast to confirm. 2. Otherwise negative neck MRA. No cervical right carotid or vertebral artery abnormality identified. Electronically Signed: By: Genevie Ann M.D. On: 08/22/2019 14:53   MR BRAIN WO CONTRAST  Result Date: 08/22/2019 CLINICAL DATA:  69 year old  male with abnormal speech, stroke symptoms. EXAM: MRI HEAD WITHOUT CONTRAST TECHNIQUE: Multiplanar, multiecho pulse sequences of the brain and surrounding structures were obtained without intravenous contrast. COMPARISON:  Head CT earlier today. FINDINGS: Brain: No restricted diffusion or evidence of acute infarction. No midline shift, mass effect, evidence of mass lesion, ventriculomegaly, extra-axial collection or acute intracranial hemorrhage. Cervicomedullary junction and pituitary are within normal limits. A small intraventricular adhesion is suspected near the right frontal horn (series 7, image 15, normal variant). There is abnormal T2 and FLAIR hyperintensity in the left cerebellar peduncle on series 7 image 7 where diffusion appears facilitated (series 4, image 14). The abnormality is about 6-7 millimeters. The trace diffusion image does appear mildly abnormal here. T1 signal is also mildly decreased. No associated hemorrhage. Elsewhere gray and white matter signal appears within normal limits for age, including in the brainstem. No cortical encephalomalacia or chronic cerebral blood products identified. Cerebral volume appears within normal limits for age. Vascular: Major intracranial vascular flow voids are preserved. Skull and upper cervical spine: Negative visible cervical spine. Visualized bone marrow signal is within normal limits. Sinuses/Orbits: Negative orbits. Paranasal sinuses and mastoids are stable and well pneumatized. Other: Visible internal auditory structures appear normal. Scalp and face soft tissues appear negative. IMPRESSION: No acute infarct identified, but there is a solitary 7 mm area of nonspecific signal abnormality in the left cerebellar peduncle. Elsewhere the brain appears within normal limits for age. Follow-up post-contrast imaging is recommended to exclude neoplasm. But otherwise this resembles a nonspecific area of gliosis, with no associated mass effect, hemosiderin or  mineralization. No associated brainstem or cerebellar abnormality. Electronically Signed   By: Genevie Ann M.D.   On: 08/22/2019 14:46   ECHOCARDIOGRAM COMPLETE  Result Date: 08/23/2019   ECHOCARDIOGRAM REPORT   Patient Name:   JAYKE CAUL Date of Exam: 08/23/2019 Medical Rec #:  [id number]       Height:       70.0 in Accession #:    [id number]      Weight:       181.0 lb Date of Birth:  [birth year]       BSA:          2.00 m Patient Age:    83 years        BP:           118/73 mmHg Patient Gender: M               HR:           69 bpm. Exam Location:  Inpatient Procedure: 2D Echo, Color Doppler and Cardiac Doppler Indications:    TIA 435.9  History:        Patient has no prior history of Echocardiogram examinations.                 TIA; Risk Factors:Former Smoker.  Sonographer:  Paulita Fujita RDCS Referring Phys: [place]  1. Left ventricular ejection fraction, by visual estimation, is 60 to 65%. The left ventricle has normal function. There is no left ventricular hypertrophy.  2. The left ventricle has no regional wall motion abnormalities.  3. Global right ventricle has normal systolic function.The right ventricular size is normal. No increase in right ventricular wall thickness.  4. Left atrial size was normal.  5. Right atrial size was normal.  6. The mitral valve is normal in structure. No evidence of mitral valve regurgitation. No evidence of mitral stenosis.  7. The tricuspid valve is normal in structure.  8. The aortic valve is normal in structure. Aortic valve regurgitation is not visualized. Mild aortic valve sclerosis without stenosis.  9. The pulmonic valve was normal in structure. Pulmonic valve regurgitation is not visualized. 10. Normal pulmonary artery systolic pressure. 11. The inferior vena cava is normal in size with greater than 50% respiratory variability, suggesting right atrial pressure of 3 mmHg. FINDINGS  Left Ventricle: Left ventricular ejection fraction,  by visual estimation, is 60 to 65%. The left ventricle has normal function. The left ventricle has no regional wall motion abnormalities. There is no left ventricular hypertrophy. Left ventricular diastolic parameters were normal. Normal left atrial pressure. Right Ventricle: The right ventricular size is normal. No increase in right ventricular wall thickness. Global RV systolic function is has normal systolic function. The tricuspid regurgitant velocity is 1.92 m/s, and with an assumed right atrial pressure  of 3 mmHg, the estimated right ventricular systolic pressure is normal at 17.7 mmHg. Left Atrium: Left atrial size was normal in size. Right Atrium: Right atrial size was normal in size Pericardium: There is no evidence of pericardial effusion. Mitral Valve: The mitral valve is normal in structure. No evidence of mitral valve regurgitation. No evidence of mitral valve stenosis by observation. Tricuspid Valve: The tricuspid valve is normal in structure. Tricuspid valve regurgitation is trivial. Aortic Valve: The aortic valve is normal in structure. Aortic valve regurgitation is not visualized. Mild aortic valve sclerosis is present, with no evidence of aortic valve stenosis. Pulmonic Valve: The pulmonic valve was normal in structure. Pulmonic valve regurgitation is not visualized. Pulmonic regurgitation is not visualized. Aorta: The aortic root, ascending aorta and aortic arch are all structurally normal, with no evidence of dilitation or obstruction. Venous: The inferior vena cava is normal in size with greater than 50% respiratory variability, suggesting right atrial pressure of 3 mmHg. IAS/Shunts: No atrial level shunt detected by color flow Doppler. There is no evidence of a patent foramen ovale. No ventricular septal defect is seen or detected. There is no evidence of an atrial septal defect.  LEFT VENTRICLE PLAX 2D LVIDd:         4.89 cm  Diastology LVIDs:         3.58 cm  LV e' lateral:   10.10 cm/s LV  PW:         0.66 cm  LV E/e' lateral: 5.4 LV IVS:        0.67 cm  LV e' medial:    6.96 cm/s LVOT diam:     1.80 cm  LV E/e' medial:  7.8 LV SV:         59 ml LV SV Index:   28.93 LVOT Area:     2.54 cm  RIGHT VENTRICLE RV S prime:     10.20 cm/s TAPSE (M-mode): 1.9 cm LEFT ATRIUM  Index       RIGHT ATRIUM           Index LA diam:        3.00 cm 1.50 cm/m  RA Area:     10.60 cm LA Vol (A2C):   37.7 ml 18.84 ml/m RA Volume:   20.40 ml  10.20 ml/m LA Vol (A4C):   32.3 ml 16.14 ml/m LA Biplane Vol: 35.4 ml 17.69 ml/m  AORTIC VALVE LVOT Vmax:   97.90 cm/s LVOT Vmean:  58.000 cm/s LVOT VTI:    0.204 m  AORTA Ao Root diam: 3.20 cm MITRAL VALVE                        TRICUSPID VALVE MV Area (PHT): 3.85 cm             TR Peak grad:   14.7 mmHg MV PHT:        57.13 msec           TR Vmax:        192.00 cm/s MV Decel Time: 197 msec MV E velocity: 54.40 cm/s 103 cm/s  SHUNTS MV A velocity: 50.60 cm/s 70.3 cm/s Systemic VTI:  0.20 m MV E/A ratio:  1.08       1.5       Systemic Diam: 1.80 cm  Mihai Croitoru MD Electronically signed by Sanda Klein MD Signature Date/Time: 08/23/2019/11:36:53 AM    Final    IR ANGIO INTRA EXTRACRAN SEL COM CAROTID INNOMINATE BILAT MOD SED  Result Date: 08/25/2019 CLINICAL DATA:  History of left facial and mouth tingling. Workup with CT angiogram of the head and neck revealed abnormalities of the left common carotid artery proximally. EXAM: EXAM BILATERAL COMMON CAROTID AND INNOMINATE ANGIOGRAPHY COMPARISON:  CT scan of the head and neck of August 22, 2019. MEDICATIONS: Heparin 1000 units IV; no antibiotic was administered within 1 hour of the procedure. ANESTHESIA/SEDATION: Versed 1 mg IV; Fentanyl 50 mcg IV Moderate Sedation Time:  32 minutes The patient was continuously monitored during the procedure by the interventional radiology nurse under my direct supervision. CONTRAST:  Isovue 300 approximately 60 mL. FLUOROSCOPY TIME:  Fluoroscopy Time: 9 minutes 30 seconds  (894 mGy). COMPLICATIONS: None immediate. TECHNIQUE: Informed written consent was obtained from the patient after a thorough discussion of the procedural risks, benefits and alternatives. All questions were addressed. Maximal Sterile Barrier Technique was utilized including caps, mask, sterile gowns, sterile gloves, sterile drape, hand hygiene and skin antiseptic. A timeout was performed prior to the initiation of the procedure. The right groin was prepped and draped in the usual sterile fashion. Thereafter using modified Seldinger technique, transfemoral access into the right common femoral artery was obtained without difficulty. Over a 0.035 inch guidewire, a 5 French Pinnacle sheath was inserted. Through this, and also over 0.035 inch guidewire, a 5 [place] JB 1 catheter was advanced to the aortic arch region and selectively positioned in the right common carotid artery, the right vertebral artery, the left common carotid artery and the left vertebral artery. FINDINGS: The right common carotid arteriogram demonstrates the right external carotid artery at its origin to be mildly narrowed. Its branches opacify normally. The right internal carotid artery at the bulb also demonstrates minimal smooth atherosclerotic plaque along the posterior wall. No evidence of ulcerations or of intraluminal filling defects is seen. The right internal carotid artery ascends normally to the cranial skull base. The petrous, cavernous and the supraclinoid segments  are widely patent. The right middle cerebral artery and the right anterior cerebral artery opacify into the capillary and venous phases. Predominance venous drainage is via the right transverse and sigmoid sinuses. The origin of the right vertebral artery is widely patent. The vessel opacifies normally to the cranial skull base. Wide patency is seen of the right vertebrobasilar junction and the right posterior-inferior cerebellar artery. The basilar artery, the posterior  cerebral arteries, the superior cerebellar arteries and the anterior-inferior cerebellar arteries opacify into the capillary and venous phases. Unopacified blood is seen in the basilar artery from the contralateral vertebral artery. The venous phase demonstrates a non dominant left transverse sinus in its proximal 2/3. The left common carotid arteriogram demonstrates multiple saccular outpouching arising from the medial aspect of the left common carotid artery in its distal 2/3. The outpouchings measure from proximal to distal 7.6 mm x 3.8 mm, 5 mm x 7 mm, and 10 mm x 3.5 mm. An additional smaller outpouching is seen the mid segment of the left common carotid artery. These are associated with mild-to-moderate stenosis most prominent along the most proximal aspect. The left external carotid artery and its major branches are widely patent. The left internal carotid artery at the bulb to the cranial skull base demonstrates wide patency. The petrous, cavernous and the supraclinoid segments are widely patent. The left middle cerebral artery and the left anterior cerebral artery opacify into the capillary and venous phases. There is transient cross-filling via the anterior communicating artery of the right anterior cerebral A2 segment and distally. The origin of the left vertebral artery is widely patent. The vessel opacifies to the cranial skull base. Wide patency is seen of the left vertebrobasilar junction and the left posterior-inferior cerebellar artery. The basilar artery, the posterior cerebral arteries, the superior cerebellar arteries and the anterior-inferior cerebellar arteries opacify into the capillary and venous phases. Unopacified blood is seen in the basilar artery from the contralateral vertebral artery. IMPRESSION: Multiple focal outpouchings arising from the medial aspect of the left common carotid artery as described. 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 Associated 50% stenosis at its maximum. PLAN: Findings reviewed with the patient also the patient's referring hospitalist. The patient was advised to add Plavix to his aspirin regimen. The patient was also advised to return in consultation in a couple of weeks to further discuss management of the above angiographic findings. The patient expressed understanding and agreement with the above management plan. Electronically Signed   By: Luanne Bras M.D.   On: 08/23/2019 14:14   IR ANGIO VERTEBRAL SEL VERTEBRAL BILAT MOD SED  Result Date: 08/28/2019 CLINICAL DATA:  History of left facial and mouth tingling. Workup with CT angiogram of the head and neck revealed abnormalities of the left common carotid artery proximally. EXAM: EXAM BILATERAL COMMON CAROTID AND INNOMINATE ANGIOGRAPHY COMPARISON:  CT scan of the head and neck of August 22, 2019. MEDICATIONS: Heparin 1000 units IV; no antibiotic was administered within 1 hour of the procedure. ANESTHESIA/SEDATION: Versed 1 mg IV; Fentanyl 50 mcg IV Moderate Sedation Time:  32 minutes The patient was continuously monitored during the procedure by the interventional radiology nurse under my direct supervision. CONTRAST:  Isovue 300 approximately 60 mL. FLUOROSCOPY TIME:  Fluoroscopy Time: 9 minutes 30 seconds (894 mGy). COMPLICATIONS: None immediate. TECHNIQUE: Informed written consent was obtained from the patient after a thorough discussion of the procedural risks, benefits and alternatives. All questions were addressed. Maximal Sterile Barrier Technique was  utilized including caps, mask, sterile gowns, sterile gloves, sterile drape, hand hygiene and skin antiseptic. A timeout was performed prior to the initiation of the procedure. The right groin was prepped and draped in the usual sterile fashion. Thereafter using modified Seldinger technique, transfemoral access into the right common femoral artery was obtained without difficulty. Over a 0.035 inch  guidewire, a 5 French Pinnacle sheath was inserted. Through this, and also over 0.035 inch guidewire, a 5 [place] JB 1 catheter was advanced to the aortic arch region and selectively positioned in the right common carotid artery, the right vertebral artery, the left common carotid artery and the left vertebral artery. FINDINGS: The right common carotid arteriogram demonstrates the right external carotid artery at its origin to be mildly narrowed. Its branches opacify normally. The right internal carotid artery at the bulb also demonstrates minimal smooth atherosclerotic plaque along the posterior wall. No evidence of ulcerations or of intraluminal filling defects is seen. The right internal carotid artery ascends normally to the cranial skull base. The petrous, cavernous and the supraclinoid segments are widely patent. The right middle cerebral artery and the right anterior cerebral artery opacify into the capillary and venous phases. Predominance venous drainage is via the right transverse and sigmoid sinuses. The origin of the right vertebral artery is widely patent. The vessel opacifies normally to the cranial skull base. Wide patency is seen of the right vertebrobasilar junction and the right posterior-inferior cerebellar artery. The basilar artery, the posterior cerebral arteries, the superior cerebellar arteries and the anterior-inferior cerebellar arteries opacify into the capillary and venous phases. Unopacified blood is seen in the basilar artery from the contralateral vertebral artery. The venous phase demonstrates a non dominant left transverse sinus in its proximal 2/3. The left common carotid arteriogram demonstrates multiple saccular outpouching arising from the medial aspect of the left common carotid artery in its distal 2/3. The outpouchings measure from proximal to distal 7.6 mm x 3.8 mm, 5 mm x 7 mm, and 10 mm x 3.5 mm. An additional smaller outpouching is seen the mid segment of the left common  carotid artery. These are associated with mild-to-moderate stenosis most prominent along the most proximal aspect. The left external carotid artery and its major branches are widely patent. The left internal carotid artery at the bulb to the cranial skull base demonstrates wide patency. The petrous, cavernous and the supraclinoid segments are widely patent. The left middle cerebral artery and the left anterior cerebral artery opacify into the capillary and venous phases. There is transient cross-filling via the anterior communicating artery of the right anterior cerebral A2 segment and distally. The origin of the left vertebral artery is widely patent. The vessel opacifies to the cranial skull base. Wide patency is seen of the left vertebrobasilar junction and the left posterior-inferior cerebellar artery. The basilar artery, the posterior cerebral arteries, the superior cerebellar arteries and the anterior-inferior cerebellar arteries opacify into the capillary and venous phases. Unopacified blood is seen in the basilar artery from the contralateral vertebral artery. IMPRESSION: Multiple focal outpouchings arising from the medial aspect of the left common carotid artery as described. 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 Associated 50% stenosis at its maximum. PLAN: Findings reviewed with the patient also the patient's referring hospitalist. The patient was advised to add Plavix to his aspirin regimen. The patient was also advised to return in consultation in a couple of weeks to further discuss management of the above angiographic findings. The patient expressed  understanding and agreement with the above management plan. Electronically Signed   By: Luanne Bras M.D.   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 Dr. Estanislado Pandy was present for consultation. Discussed findings of diagnostic cerebral arteriogram 08/23/2019. Explained that there are two management options moving forward- either continued conservative management including DAPT and routine imaging scans to monitor for changes, or with an endovascular embolization (psuedoaneurysms)/revascularization (stenosis) procedure. Explained procedure, including risks and benefits. Since patient is grossly asymptomatic at this time, recommend continued conservative management (CTA at this time, if stable will convert to carotid US).  Discussed activity. Advised patient no weight lifting (if needing to lift items at work, ok but do not lift above head). Avoid quick rotational movements of neck. No chiropractor.  Advised patient to call 911 if he notices stroke like symptoms (right facial droop, right-sided weakness/numbness/tingling, dizziness, left-sided tinnitus or blurred vision, speech difficulty)- might indicate need to treat pseudoaneurysms if this occurs.  Plan for follow-up with CTA neck (with contrast) 6 months from recent procedure 08/23/2019. Informed patient that our schedulers will call him to set up this imaging scan. Instructed patient to continue taking Plavix 75 mg once daily and Aspirin 81 mg once daily.  All questions answered and concerns addressed. Patient conveys understanding and agrees with plan.  Thank you for this interesting consult.  I greatly enjoyed meeting [facility] O[facility] and look forward to participating in their care.  A copy of this report was sent to  the requesting provider on this date.  Electronically Signed: Earley Abide, PA-C 09/11/2019, 11:59 AM   I spent a total of 40 Minutes in face to face in clinical consultation, greater than 50% of which was counseling/coordinating care for left CCA pseudoaneurysms with associated stenosis.

## 2019-09-12 ENCOUNTER — Ambulatory Visit (HOSPITAL_COMMUNITY): Payer: Medicare Other

## 2019-09-19 DIAGNOSIS — Z923 Personal history of irradiation: Secondary | ICD-10-CM | POA: Diagnosis not present

## 2019-09-19 DIAGNOSIS — Z85818 Personal history of malignant neoplasm of other sites of lip, oral cavity, and pharynx: Secondary | ICD-10-CM | POA: Diagnosis not present

## 2019-09-19 DIAGNOSIS — I6522 Occlusion and stenosis of left carotid artery: Secondary | ICD-10-CM | POA: Diagnosis not present

## 2019-09-19 DIAGNOSIS — Z08 Encounter for follow-up examination after completed treatment for malignant neoplasm: Secondary | ICD-10-CM | POA: Diagnosis not present

## 2019-09-30 DIAGNOSIS — E7849 Other hyperlipidemia: Secondary | ICD-10-CM | POA: Diagnosis not present

## 2019-09-30 DIAGNOSIS — E063 Autoimmune thyroiditis: Secondary | ICD-10-CM | POA: Diagnosis not present

## 2019-09-30 DIAGNOSIS — E1140 Type 2 diabetes mellitus with diabetic neuropathy, unspecified: Secondary | ICD-10-CM | POA: Diagnosis not present

## 2019-09-30 DIAGNOSIS — I10 Essential (primary) hypertension: Secondary | ICD-10-CM | POA: Diagnosis not present

## 2019-10-28 DIAGNOSIS — I10 Essential (primary) hypertension: Secondary | ICD-10-CM | POA: Diagnosis not present

## 2019-10-28 DIAGNOSIS — E7849 Other hyperlipidemia: Secondary | ICD-10-CM | POA: Diagnosis not present

## 2019-10-28 DIAGNOSIS — E1140 Type 2 diabetes mellitus with diabetic neuropathy, unspecified: Secondary | ICD-10-CM | POA: Diagnosis not present

## 2019-10-28 DIAGNOSIS — E063 Autoimmune thyroiditis: Secondary | ICD-10-CM | POA: Diagnosis not present

## 2019-11-28 DIAGNOSIS — I10 Essential (primary) hypertension: Secondary | ICD-10-CM | POA: Diagnosis not present

## 2019-11-28 DIAGNOSIS — E1140 Type 2 diabetes mellitus with diabetic neuropathy, unspecified: Secondary | ICD-10-CM | POA: Diagnosis not present

## 2019-11-28 DIAGNOSIS — E039 Hypothyroidism, unspecified: Secondary | ICD-10-CM | POA: Diagnosis not present

## 2019-11-28 DIAGNOSIS — E7849 Other hyperlipidemia: Secondary | ICD-10-CM | POA: Diagnosis not present

## 2019-12-04 DIAGNOSIS — I7771 Dissection of carotid artery: Secondary | ICD-10-CM | POA: Diagnosis not present

## 2019-12-04 DIAGNOSIS — I739 Peripheral vascular disease, unspecified: Secondary | ICD-10-CM | POA: Diagnosis not present

## 2019-12-28 DIAGNOSIS — E1140 Type 2 diabetes mellitus with diabetic neuropathy, unspecified: Secondary | ICD-10-CM | POA: Diagnosis not present

## 2019-12-28 DIAGNOSIS — I10 Essential (primary) hypertension: Secondary | ICD-10-CM | POA: Diagnosis not present

## 2019-12-28 DIAGNOSIS — E063 Autoimmune thyroiditis: Secondary | ICD-10-CM | POA: Diagnosis not present

## 2019-12-28 DIAGNOSIS — E7849 Other hyperlipidemia: Secondary | ICD-10-CM | POA: Diagnosis not present

## 2020-01-28 DIAGNOSIS — E7849 Other hyperlipidemia: Secondary | ICD-10-CM | POA: Diagnosis not present

## 2020-01-28 DIAGNOSIS — E1140 Type 2 diabetes mellitus with diabetic neuropathy, unspecified: Secondary | ICD-10-CM | POA: Diagnosis not present

## 2020-01-28 DIAGNOSIS — E063 Autoimmune thyroiditis: Secondary | ICD-10-CM | POA: Diagnosis not present

## 2020-01-28 DIAGNOSIS — I10 Essential (primary) hypertension: Secondary | ICD-10-CM | POA: Diagnosis not present

## 2020-02-18 ENCOUNTER — Other Ambulatory Visit (HOSPITAL_COMMUNITY): Payer: Self-pay | Admitting: Interventional Radiology

## 2020-02-26 ENCOUNTER — Telehealth (HOSPITAL_COMMUNITY): Payer: Self-pay

## 2020-02-26 NOTE — Telephone Encounter (Signed)
Called to schedule cta neck. Pt following up with physician at [facility] in October. He will call back if he needs to schedule here. AW

## 2020-02-27 DIAGNOSIS — E1140 Type 2 diabetes mellitus with diabetic neuropathy, unspecified: Secondary | ICD-10-CM | POA: Diagnosis not present

## 2020-02-27 DIAGNOSIS — I10 Essential (primary) hypertension: Secondary | ICD-10-CM | POA: Diagnosis not present

## 2020-02-27 DIAGNOSIS — E063 Autoimmune thyroiditis: Secondary | ICD-10-CM | POA: Diagnosis not present

## 2020-02-27 DIAGNOSIS — E7849 Other hyperlipidemia: Secondary | ICD-10-CM | POA: Diagnosis not present

## 2020-03-14 DIAGNOSIS — J329 Chronic sinusitis, unspecified: Secondary | ICD-10-CM | POA: Diagnosis not present

## 2020-03-14 DIAGNOSIS — E063 Autoimmune thyroiditis: Secondary | ICD-10-CM | POA: Diagnosis not present

## 2020-03-14 DIAGNOSIS — I10 Essential (primary) hypertension: Secondary | ICD-10-CM | POA: Diagnosis not present

## 2020-03-14 DIAGNOSIS — J209 Acute bronchitis, unspecified: Secondary | ICD-10-CM | POA: Diagnosis not present

## 2020-03-24 DIAGNOSIS — E063 Autoimmune thyroiditis: Secondary | ICD-10-CM | POA: Diagnosis not present

## 2020-03-24 DIAGNOSIS — Z1389 Encounter for screening for other disorder: Secondary | ICD-10-CM | POA: Diagnosis not present

## 2020-03-24 DIAGNOSIS — Z0001 Encounter for general adult medical examination with abnormal findings: Secondary | ICD-10-CM | POA: Diagnosis not present

## 2020-03-24 DIAGNOSIS — I10 Essential (primary) hypertension: Secondary | ICD-10-CM | POA: Diagnosis not present

## 2020-03-24 DIAGNOSIS — E039 Hypothyroidism, unspecified: Secondary | ICD-10-CM | POA: Diagnosis not present

## 2020-03-24 DIAGNOSIS — E1140 Type 2 diabetes mellitus with diabetic neuropathy, unspecified: Secondary | ICD-10-CM | POA: Diagnosis not present

## 2020-03-25 DIAGNOSIS — E039 Hypothyroidism, unspecified: Secondary | ICD-10-CM | POA: Diagnosis not present

## 2020-03-25 DIAGNOSIS — Z1389 Encounter for screening for other disorder: Secondary | ICD-10-CM | POA: Diagnosis not present

## 2020-03-25 DIAGNOSIS — Z0001 Encounter for general adult medical examination with abnormal findings: Secondary | ICD-10-CM | POA: Diagnosis not present

## 2020-03-28 DIAGNOSIS — E7849 Other hyperlipidemia: Secondary | ICD-10-CM | POA: Diagnosis not present

## 2020-03-28 DIAGNOSIS — E063 Autoimmune thyroiditis: Secondary | ICD-10-CM | POA: Diagnosis not present

## 2020-03-28 DIAGNOSIS — E1140 Type 2 diabetes mellitus with diabetic neuropathy, unspecified: Secondary | ICD-10-CM | POA: Diagnosis not present

## 2020-03-28 DIAGNOSIS — I10 Essential (primary) hypertension: Secondary | ICD-10-CM | POA: Diagnosis not present

## 2020-04-16 ENCOUNTER — Ambulatory Visit (HOSPITAL_COMMUNITY)
Admission: RE | Admit: 2020-04-16 | Discharge: 2020-04-16 | Disposition: A | Discharge status: Home / Self Care | Payer: Medicare Other | Source: Ambulatory Visit | Attending: Internal Medicine | Admitting: Internal Medicine

## 2020-04-16 ENCOUNTER — Other Ambulatory Visit (HOSPITAL_COMMUNITY): Payer: Self-pay | Admitting: Internal Medicine

## 2020-04-16 ENCOUNTER — Other Ambulatory Visit: Payer: Self-pay

## 2020-04-16 DIAGNOSIS — Z23 Encounter for immunization: Secondary | ICD-10-CM | POA: Diagnosis not present

## 2020-04-16 DIAGNOSIS — M26623 Arthralgia of bilateral temporomandibular joint: Secondary | ICD-10-CM | POA: Diagnosis not present

## 2020-04-16 DIAGNOSIS — M25571 Pain in right ankle and joints of right foot: Secondary | ICD-10-CM

## 2020-04-16 DIAGNOSIS — M7989 Other specified soft tissue disorders: Secondary | ICD-10-CM | POA: Diagnosis not present

## 2020-04-16 DIAGNOSIS — S99911A Unspecified injury of right ankle, initial encounter: Secondary | ICD-10-CM | POA: Diagnosis not present

## 2020-04-16 DIAGNOSIS — E063 Autoimmune thyroiditis: Secondary | ICD-10-CM | POA: Diagnosis not present

## 2020-04-16 DIAGNOSIS — Z6824 Body mass index (BMI) 24.0-24.9, adult: Secondary | ICD-10-CM | POA: Diagnosis not present

## 2020-04-16 IMAGING — DX DG ANKLE COMPLETE 3+V*R*
3 series · 3 of 3 positions shown · non-contrast
Comparison: None.

CLINICAL DATA: Fall, ankle pain

EXAM:
RIGHT ANKLE - COMPLETE 3+ VIEW

[ankle ap]
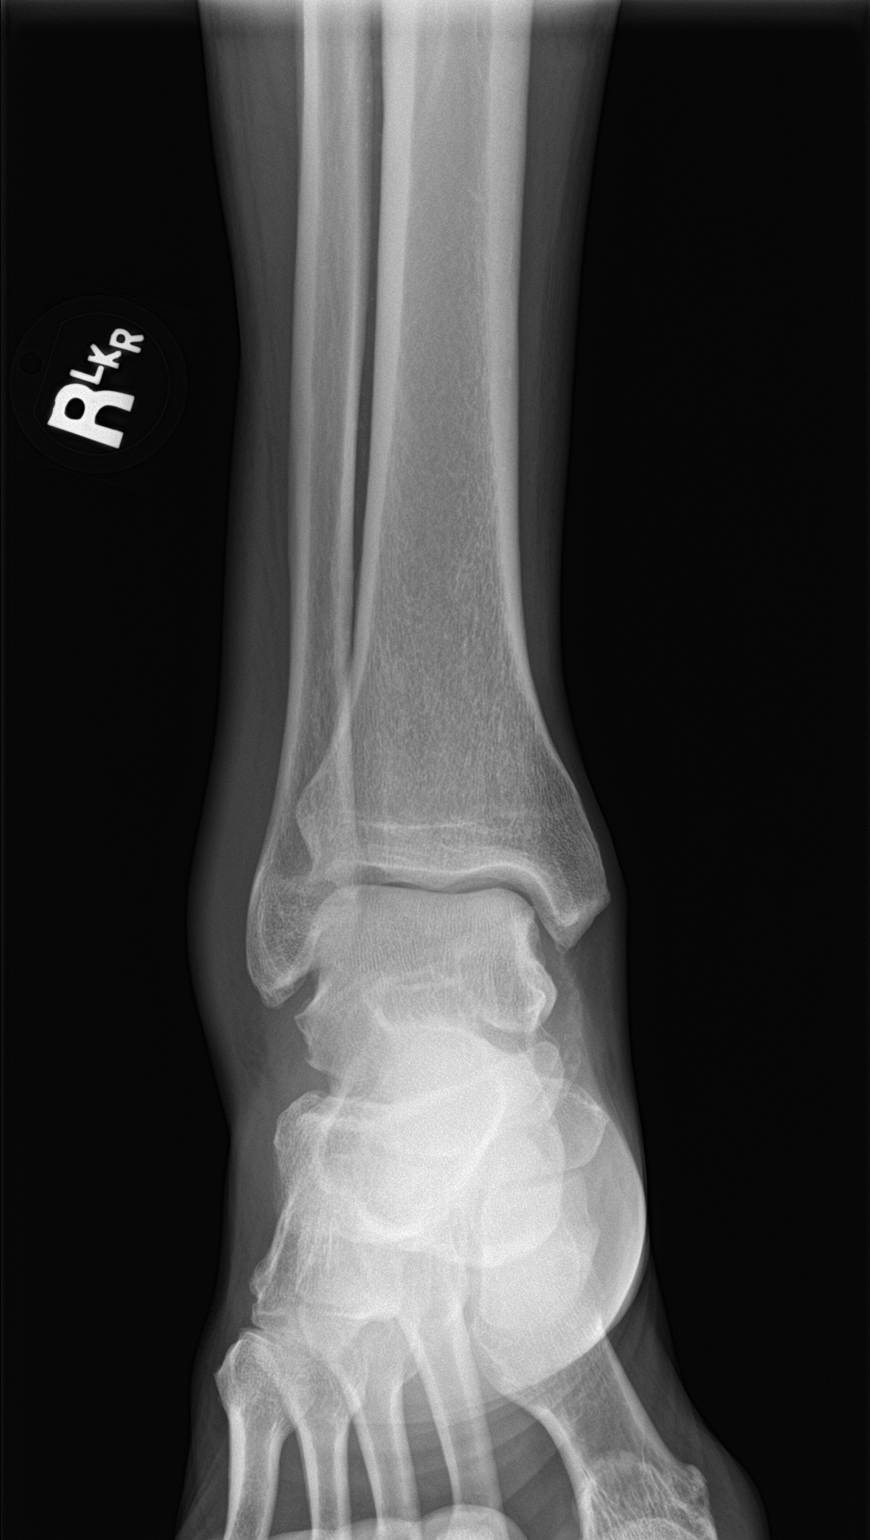

[ankle obl]
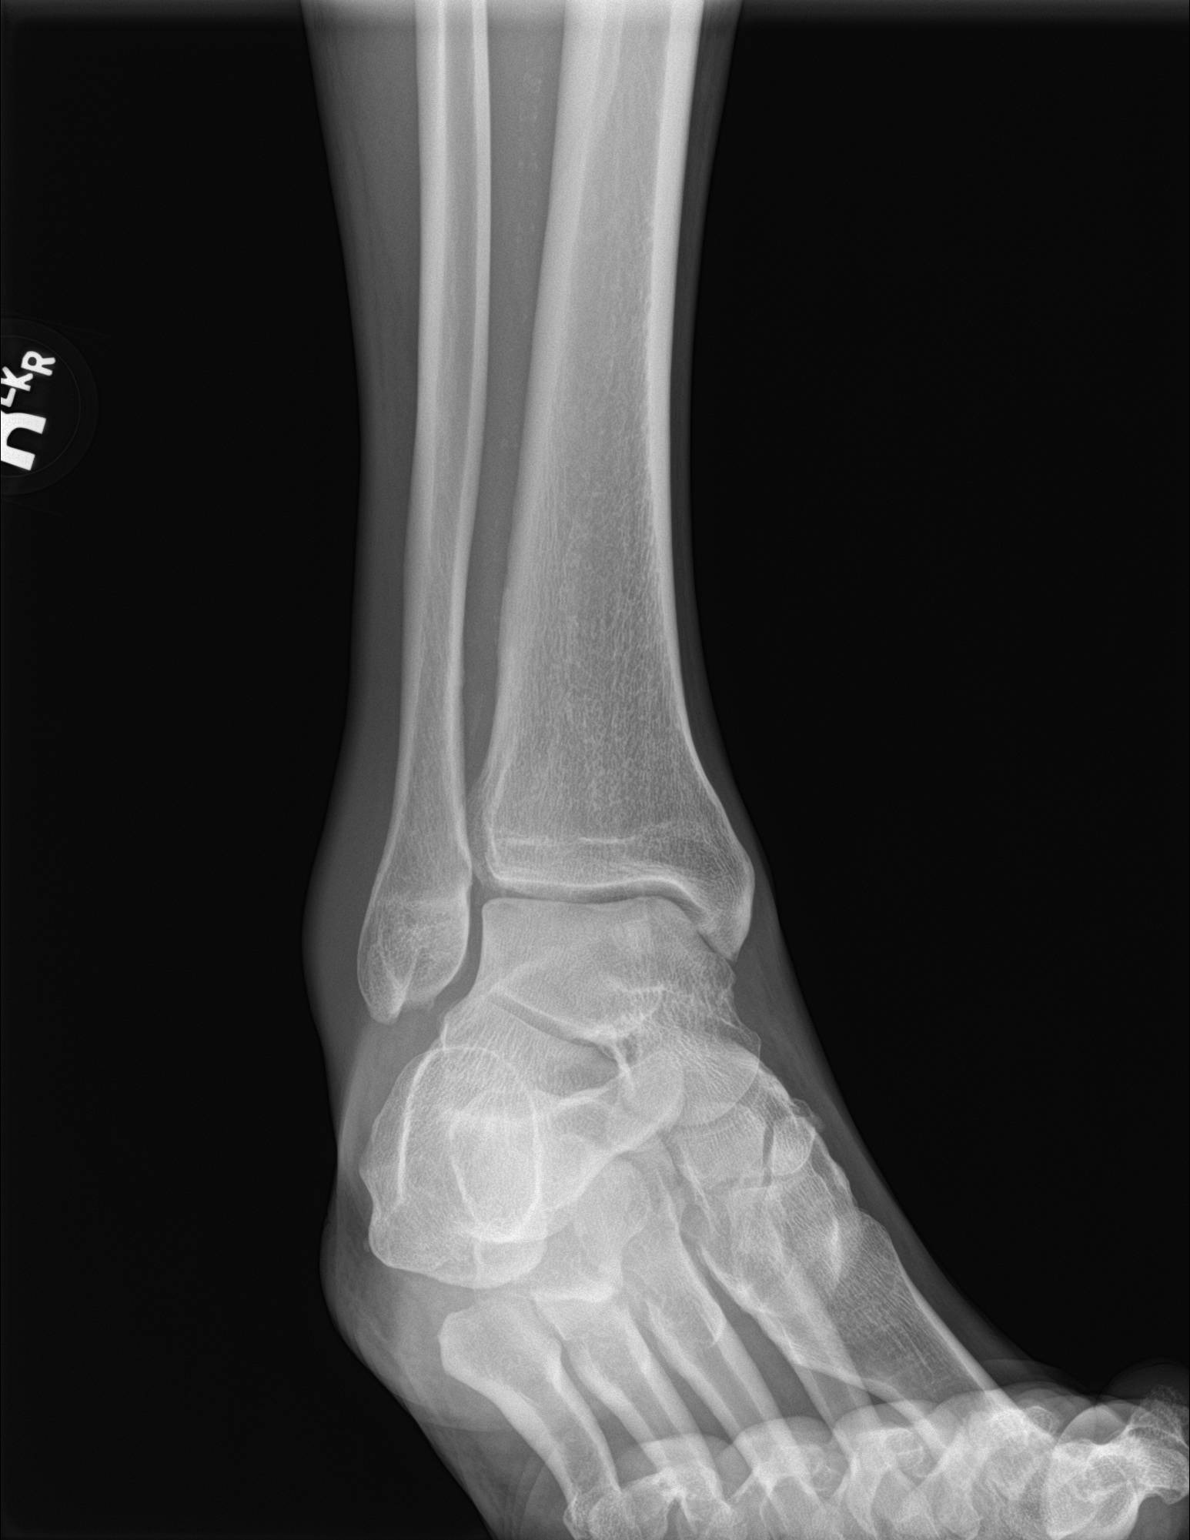

[ankle lat]
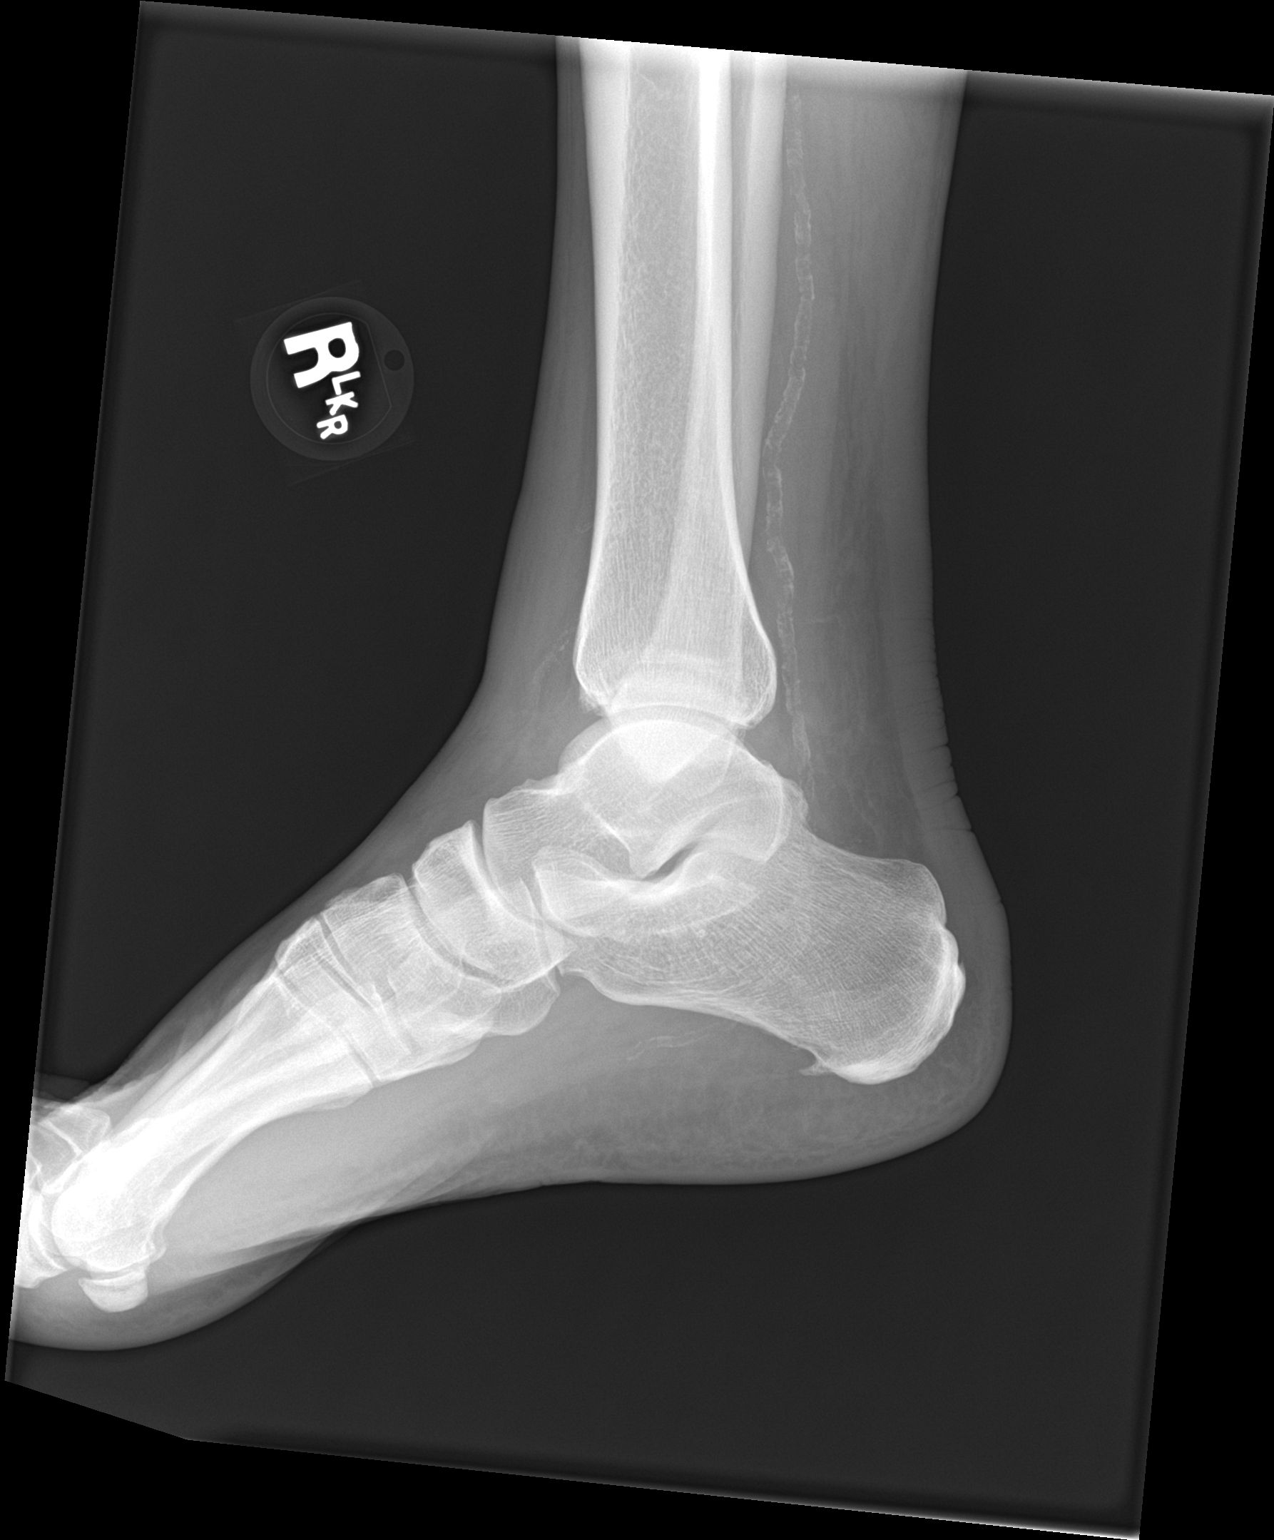

[3 of 3 positions shown; findings below may reference images not displayed]

FINDINGS: Soft tissue swelling at the lateral malleolus. Alignment is
anatomic. There is no acute fracture. Joint space is preserved.
Vascular calcification is noted.
IMPRESSION: No acute fracture.

## 2020-04-29 DIAGNOSIS — E063 Autoimmune thyroiditis: Secondary | ICD-10-CM | POA: Diagnosis not present

## 2020-04-29 DIAGNOSIS — E1140 Type 2 diabetes mellitus with diabetic neuropathy, unspecified: Secondary | ICD-10-CM | POA: Diagnosis not present

## 2020-04-29 DIAGNOSIS — I10 Essential (primary) hypertension: Secondary | ICD-10-CM | POA: Diagnosis not present

## 2020-04-29 DIAGNOSIS — E7849 Other hyperlipidemia: Secondary | ICD-10-CM | POA: Diagnosis not present

## 2020-05-29 DIAGNOSIS — E1140 Type 2 diabetes mellitus with diabetic neuropathy, unspecified: Secondary | ICD-10-CM | POA: Diagnosis not present

## 2020-05-29 DIAGNOSIS — I10 Essential (primary) hypertension: Secondary | ICD-10-CM | POA: Diagnosis not present

## 2020-05-29 DIAGNOSIS — E7849 Other hyperlipidemia: Secondary | ICD-10-CM | POA: Diagnosis not present

## 2020-05-29 DIAGNOSIS — E039 Hypothyroidism, unspecified: Secondary | ICD-10-CM | POA: Diagnosis not present

## 2020-06-06 DIAGNOSIS — I7771 Dissection of carotid artery: Secondary | ICD-10-CM | POA: Diagnosis not present

## 2020-06-28 DIAGNOSIS — I10 Essential (primary) hypertension: Secondary | ICD-10-CM | POA: Diagnosis not present

## 2020-06-28 DIAGNOSIS — E039 Hypothyroidism, unspecified: Secondary | ICD-10-CM | POA: Diagnosis not present

## 2020-06-28 DIAGNOSIS — E1140 Type 2 diabetes mellitus with diabetic neuropathy, unspecified: Secondary | ICD-10-CM | POA: Diagnosis not present

## 2020-06-28 DIAGNOSIS — E7849 Other hyperlipidemia: Secondary | ICD-10-CM | POA: Diagnosis not present

## 2020-07-29 DIAGNOSIS — E7849 Other hyperlipidemia: Secondary | ICD-10-CM | POA: Diagnosis not present

## 2020-07-29 DIAGNOSIS — E039 Hypothyroidism, unspecified: Secondary | ICD-10-CM | POA: Diagnosis not present

## 2020-07-29 DIAGNOSIS — I10 Essential (primary) hypertension: Secondary | ICD-10-CM | POA: Diagnosis not present

## 2020-07-29 DIAGNOSIS — E1140 Type 2 diabetes mellitus with diabetic neuropathy, unspecified: Secondary | ICD-10-CM | POA: Diagnosis not present

## 2020-08-29 ENCOUNTER — Other Ambulatory Visit: Payer: Self-pay

## 2020-08-29 ENCOUNTER — Ambulatory Visit
Admission: EM | Admit: 2020-08-29 | Discharge: 2020-08-29 | Disposition: A | Discharge status: Home / Self Care | Payer: Medicare Other | Attending: Family Medicine | Admitting: Family Medicine

## 2020-08-29 DIAGNOSIS — I10 Essential (primary) hypertension: Secondary | ICD-10-CM | POA: Diagnosis not present

## 2020-08-29 DIAGNOSIS — Z7689 Persons encountering health services in other specified circumstances: Secondary | ICD-10-CM | POA: Diagnosis not present

## 2020-08-29 DIAGNOSIS — Z20822 Contact with and (suspected) exposure to covid-19: Secondary | ICD-10-CM | POA: Diagnosis not present

## 2020-08-29 DIAGNOSIS — E7849 Other hyperlipidemia: Secondary | ICD-10-CM | POA: Diagnosis not present

## 2020-08-29 DIAGNOSIS — E1140 Type 2 diabetes mellitus with diabetic neuropathy, unspecified: Secondary | ICD-10-CM | POA: Diagnosis not present

## 2020-08-29 DIAGNOSIS — E039 Hypothyroidism, unspecified: Secondary | ICD-10-CM | POA: Diagnosis not present

## 2020-09-02 ENCOUNTER — Ambulatory Visit: Payer: Self-pay | Admitting: *Deleted

## 2020-09-02 LAB — NOVEL CORONAVIRUS, NAA: SARS-CoV-2, NAA: NOT DETECTED

## 2020-09-02 NOTE — Telephone Encounter (Signed)
  Reason for Disposition . Health Information question, no triage required and triager able to answer question  Answer Assessment - Initial Assessment Questions 1. REASON FOR CALL or QUESTION: "What is your reason for calling today?" or "How can I best help you?" or "What question do you have that I can help answer?"     Calling in for his covid test result that was done on 08/29/2020 at [facility] urgent [facility] in [place].  See documentation notes.  Protocols used: INFORMATION ONLY CALL - NO TRIAGE-A-AH

## 2020-09-02 NOTE — Telephone Encounter (Signed)
Pt called in requesting his covid 19 test result.  It was collected on 08/29/2020 at the [facility] in [place]. I was informed 08/29/2020 was a holiday so it was not picked up until yesterday 09/01/2020 so it will be ready in the next day or 2.  I called Samuel Barr back and let him know the above information.   He thanked me very much for my time and help.  "It meant a lot to me that you did this for me".

## 2020-09-22 DIAGNOSIS — E119 Type 2 diabetes mellitus without complications: Secondary | ICD-10-CM | POA: Diagnosis not present

## 2020-09-22 DIAGNOSIS — Z0100 Encounter for examination of eyes and vision without abnormal findings: Secondary | ICD-10-CM | POA: Diagnosis not present

## 2020-09-27 DIAGNOSIS — E7849 Other hyperlipidemia: Secondary | ICD-10-CM | POA: Diagnosis not present

## 2020-09-27 DIAGNOSIS — E039 Hypothyroidism, unspecified: Secondary | ICD-10-CM | POA: Diagnosis not present

## 2020-09-27 DIAGNOSIS — E1140 Type 2 diabetes mellitus with diabetic neuropathy, unspecified: Secondary | ICD-10-CM | POA: Diagnosis not present

## 2020-09-27 DIAGNOSIS — I10 Essential (primary) hypertension: Secondary | ICD-10-CM | POA: Diagnosis not present

## 2020-10-21 DIAGNOSIS — J329 Chronic sinusitis, unspecified: Secondary | ICD-10-CM | POA: Diagnosis not present

## 2020-10-27 DIAGNOSIS — I10 Essential (primary) hypertension: Secondary | ICD-10-CM | POA: Diagnosis not present

## 2020-10-27 DIAGNOSIS — E039 Hypothyroidism, unspecified: Secondary | ICD-10-CM | POA: Diagnosis not present

## 2020-10-27 DIAGNOSIS — E1140 Type 2 diabetes mellitus with diabetic neuropathy, unspecified: Secondary | ICD-10-CM | POA: Diagnosis not present

## 2020-10-27 DIAGNOSIS — E7849 Other hyperlipidemia: Secondary | ICD-10-CM | POA: Diagnosis not present

## 2020-11-26 DIAGNOSIS — E039 Hypothyroidism, unspecified: Secondary | ICD-10-CM | POA: Diagnosis not present

## 2020-11-26 DIAGNOSIS — E1140 Type 2 diabetes mellitus with diabetic neuropathy, unspecified: Secondary | ICD-10-CM | POA: Diagnosis not present

## 2020-11-26 DIAGNOSIS — E7849 Other hyperlipidemia: Secondary | ICD-10-CM | POA: Diagnosis not present

## 2020-11-26 DIAGNOSIS — I10 Essential (primary) hypertension: Secondary | ICD-10-CM | POA: Diagnosis not present

## 2020-11-30 DIAGNOSIS — J209 Acute bronchitis, unspecified: Secondary | ICD-10-CM | POA: Diagnosis not present

## 2020-12-08 DIAGNOSIS — Z85818 Personal history of malignant neoplasm of other sites of lip, oral cavity, and pharynx: Secondary | ICD-10-CM | POA: Diagnosis not present

## 2020-12-08 DIAGNOSIS — I7771 Dissection of carotid artery: Secondary | ICD-10-CM | POA: Diagnosis not present

## 2020-12-18 DIAGNOSIS — Z681 Body mass index (BMI) 19 or less, adult: Secondary | ICD-10-CM | POA: Diagnosis not present

## 2020-12-18 DIAGNOSIS — J069 Acute upper respiratory infection, unspecified: Secondary | ICD-10-CM | POA: Diagnosis not present

## 2020-12-27 DIAGNOSIS — E039 Hypothyroidism, unspecified: Secondary | ICD-10-CM | POA: Diagnosis not present

## 2020-12-27 DIAGNOSIS — E7849 Other hyperlipidemia: Secondary | ICD-10-CM | POA: Diagnosis not present

## 2020-12-27 DIAGNOSIS — E1140 Type 2 diabetes mellitus with diabetic neuropathy, unspecified: Secondary | ICD-10-CM | POA: Diagnosis not present

## 2020-12-27 DIAGNOSIS — I10 Essential (primary) hypertension: Secondary | ICD-10-CM | POA: Diagnosis not present

## 2021-01-01 DIAGNOSIS — Z1389 Encounter for screening for other disorder: Secondary | ICD-10-CM | POA: Diagnosis not present

## 2021-01-01 DIAGNOSIS — C329 Malignant neoplasm of larynx, unspecified: Secondary | ICD-10-CM | POA: Diagnosis not present

## 2021-01-01 DIAGNOSIS — I10 Essential (primary) hypertension: Secondary | ICD-10-CM | POA: Diagnosis not present

## 2021-01-01 DIAGNOSIS — M722 Plantar fascial fibromatosis: Secondary | ICD-10-CM | POA: Diagnosis not present

## 2021-01-01 DIAGNOSIS — E1140 Type 2 diabetes mellitus with diabetic neuropathy, unspecified: Secondary | ICD-10-CM | POA: Diagnosis not present

## 2021-01-01 DIAGNOSIS — J329 Chronic sinusitis, unspecified: Secondary | ICD-10-CM | POA: Diagnosis not present

## 2021-02-12 ENCOUNTER — Encounter (HOSPITAL_COMMUNITY): Payer: Self-pay | Admitting: *Deleted

## 2021-02-12 ENCOUNTER — Emergency Department (HOSPITAL_COMMUNITY): Payer: Medicare Other

## 2021-02-12 ENCOUNTER — Other Ambulatory Visit: Payer: Self-pay

## 2021-02-12 ENCOUNTER — Emergency Department (HOSPITAL_COMMUNITY)
Admission: EM | Admit: 2021-02-12 | Discharge: 2021-02-12 | Disposition: A | Discharge status: Home / Self Care | Payer: Medicare Other | Attending: Emergency Medicine | Admitting: Emergency Medicine

## 2021-02-12 DIAGNOSIS — Z7982 Long term (current) use of aspirin: Secondary | ICD-10-CM | POA: Insufficient documentation

## 2021-02-12 DIAGNOSIS — Z87891 Personal history of nicotine dependence: Secondary | ICD-10-CM | POA: Insufficient documentation

## 2021-02-12 DIAGNOSIS — R202 Paresthesia of skin: Secondary | ICD-10-CM | POA: Diagnosis not present

## 2021-02-12 DIAGNOSIS — Z79899 Other long term (current) drug therapy: Secondary | ICD-10-CM | POA: Diagnosis not present

## 2021-02-12 DIAGNOSIS — I10 Essential (primary) hypertension: Secondary | ICD-10-CM | POA: Diagnosis not present

## 2021-02-12 DIAGNOSIS — Z85818 Personal history of malignant neoplasm of other sites of lip, oral cavity, and pharynx: Secondary | ICD-10-CM | POA: Diagnosis not present

## 2021-02-12 DIAGNOSIS — R4781 Slurred speech: Secondary | ICD-10-CM

## 2021-02-12 DIAGNOSIS — R29818 Other symptoms and signs involving the nervous system: Secondary | ICD-10-CM | POA: Diagnosis not present

## 2021-02-12 DIAGNOSIS — E1165 Type 2 diabetes mellitus with hyperglycemia: Secondary | ICD-10-CM | POA: Diagnosis not present

## 2021-02-12 DIAGNOSIS — I639 Cerebral infarction, unspecified: Secondary | ICD-10-CM | POA: Diagnosis not present

## 2021-02-12 LAB — CBC WITH DIFFERENTIAL/PLATELET
Abs Immature Granulocytes: 0.03 10*3/uL (ref 0.00–0.07)
Basophils Absolute: 0 10*3/uL (ref 0.0–0.1)
Basophils Relative: 1 %
Eosinophils Absolute: 0.1 10*3/uL (ref 0.0–0.5)
Eosinophils Relative: 2 %
HCT: 41.1 % (ref 39.0–52.0)
Hemoglobin: 14.2 g/dL (ref 13.0–17.0)
Immature Granulocytes: 1 %
Lymphocytes Relative: 20 %
Lymphs Abs: 1.2 10*3/uL (ref 0.7–4.0)
MCH: 32.3 pg (ref 26.0–34.0)
MCHC: 34.5 g/dL (ref 30.0–36.0)
MCV: 93.6 fL (ref 80.0–100.0)
Monocytes Absolute: 0.5 10*3/uL (ref 0.1–1.0)
Monocytes Relative: 9 %
Neutro Abs: 4.2 10*3/uL (ref 1.7–7.7)
Neutrophils Relative %: 67 %
Platelets: 271 10*3/uL (ref 150–400)
RBC: 4.39 MIL/uL (ref 4.22–5.81)
RDW: 12.7 % (ref 11.5–15.5)
WBC: 6.2 10*3/uL (ref 4.0–10.5)
nRBC: 0 % (ref 0.0–0.2)

## 2021-02-12 LAB — PROTIME-INR
INR: 1.1 (ref 0.8–1.2)
Prothrombin Time: 14.6 seconds (ref 11.4–15.2)

## 2021-02-12 LAB — BASIC METABOLIC PANEL
Anion gap: 6 (ref 5–15)
BUN: 12 mg/dL (ref 8–23)
CO2: 29 mmol/L (ref 22–32)
Calcium: 9.1 mg/dL (ref 8.9–10.3)
Chloride: 101 mmol/L (ref 98–111)
Creatinine, Ser: 0.86 mg/dL (ref 0.61–1.24)
GFR, Estimated: >60 mL/min (ref >60)
Glucose, Bld: 356 mg/dL — ABNORMAL HIGH (ref 70–99)
Potassium: 4.4 mmol/L (ref 3.5–5.1)
Sodium: 136 mmol/L (ref 135–145)

## 2021-02-12 LAB — TROPONIN I (HIGH SENSITIVITY)
Troponin I (High Sensitivity): 3 ng/L (ref <18)
Troponin I (High Sensitivity): 2 ng/L (ref <18)

## 2021-02-12 IMAGING — MR MR HEAD WO/W CM
12 of 14 series · 36 of 48 positions shown · IV contrast (gadavist)
Comparison: MRI [DATE].

CLINICAL DATA: Neuro deficit, acute stroke suspected.

EXAM:
MRI HEAD WITHOUT AND WITH CONTRAST
TECHNIQUE: Multiplanar, multiecho pulse sequences of the brain and surrounding
structures were obtained without and with intravenous contrast.
CONTRAST:  7mL GADAVIST GADOBUTROL 1 MMOL/ML IV SOLN

[Series 5: DWI · axial · 4.0mm · 0.88mm/px · z∈[-36,+104]mm · 3 of 36 slices shown (1 of 6)]
[im 1/36]
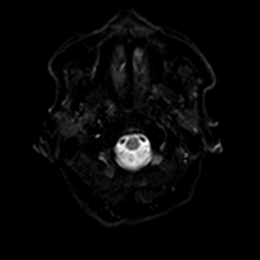
[im 18/36]
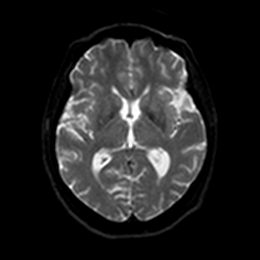
[im 36/36]
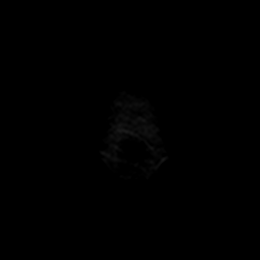

[Series 5: DWI · axial · 4.0mm · 0.88mm/px · z∈[-36,+104]mm · 4 of 36 slices shown (2 of 6)]
[im 1/36]
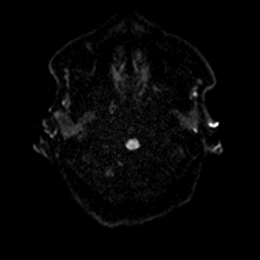
[im 12/36]
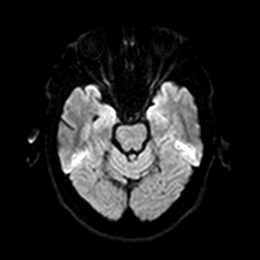
[im 24/36]
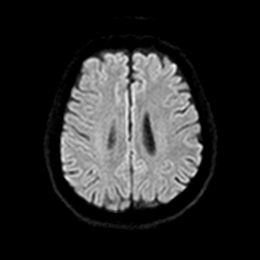
[im 36/36]
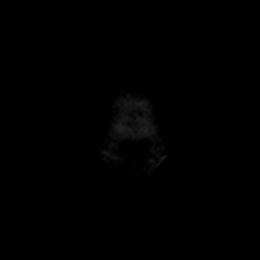

[Series 6: DWI · axial · 4.0mm · 0.88mm/px · z∈[-36,+104]mm · 4 of 36 slices shown (3 of 6)]
[im 1/36]
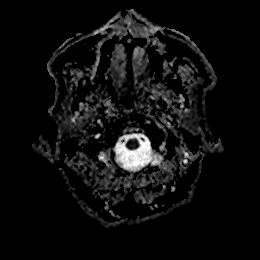
[im 12/36]
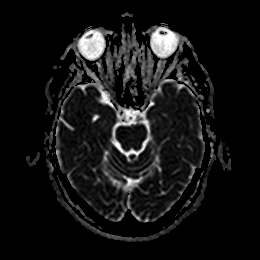
[im 24/36]
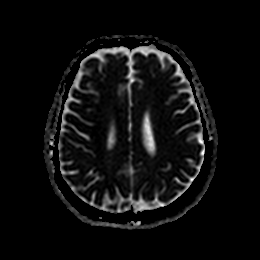
[im 36/36]
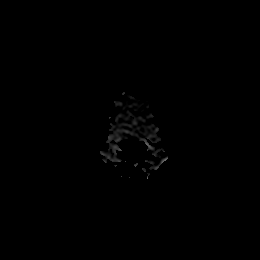

[Series 7: DWI · coronal · 5.0mm · 0.88mm/px · 3 of 26 slices shown (4 of 6)]
[im 1/26]
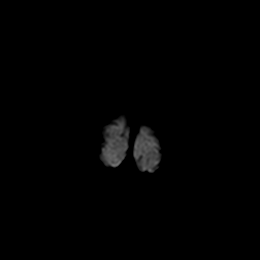
[im 13/26]
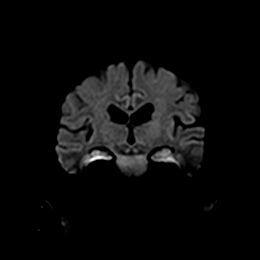
[im 26/26]
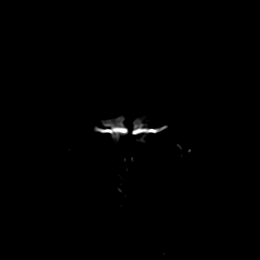

[Series 7: DWI · coronal · 5.0mm · 0.88mm/px · 3 of 26 slices shown (5 of 6)]
[im 1/26]
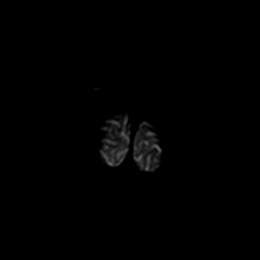
[im 13/26]
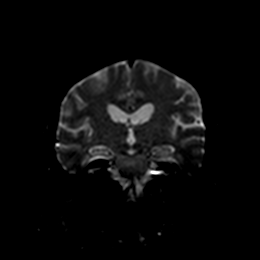
[im 26/26]
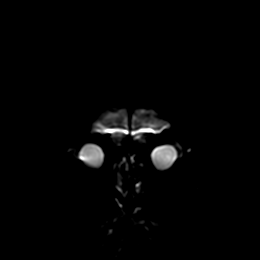

[Series 8: DWI · coronal · 5.0mm · 0.88mm/px · 3 of 26 slices shown (6 of 6)]
[im 1/26]
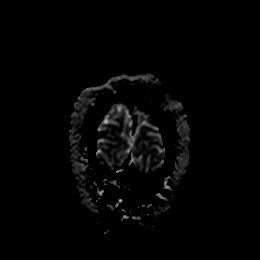
[im 13/26]
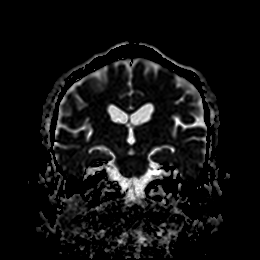
[im 26/26]
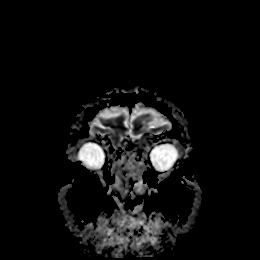

[Series 9: T1 · sagittal · 5.0mm · 0.94mm/px · 2 of 21 slices shown]
[im 1/21]
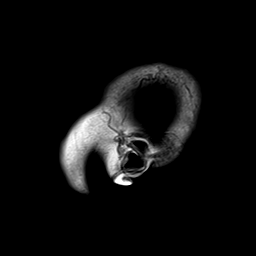
[im 21/21]
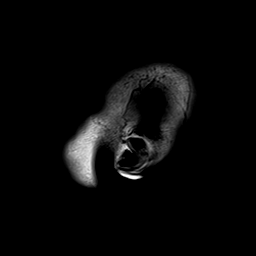

[Series 10: T2 · axial · 5.0mm · 0.72mm/px · z∈[-32,+101]mm · 2 of 20 slices shown (1 of 2)]
[im 1/20]
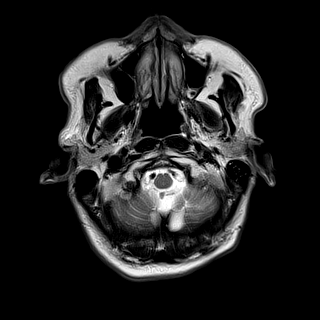
[im 20/20]
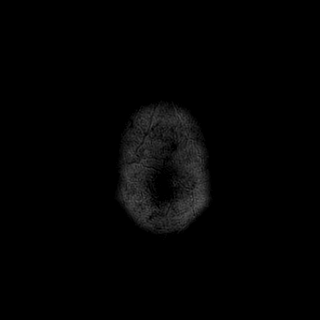

[Series 11: ax hemo · axial · 5.0mm · 0.86mm/px · z∈[-37,+107]mm · 3 of 25 slices shown]
[im 1/25]
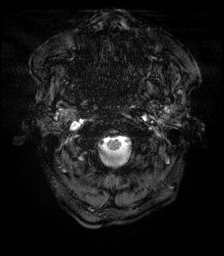
[im 13/25]
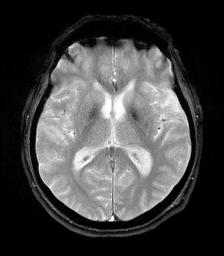
[im 25/25]
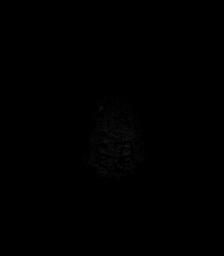

[Series 12: FLAIR · axial · 4.0mm · 0.43mm/px · z∈[-27,+97]mm · 3 of 32 slices shown]
[im 1/32]
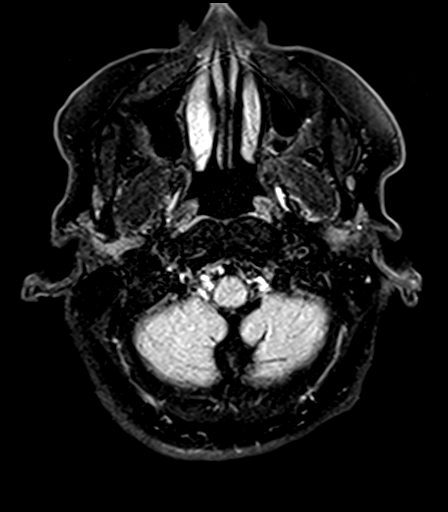
[im 16/32]
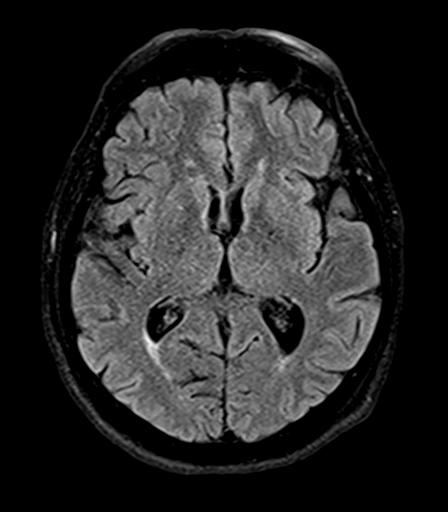
[im 32/32]
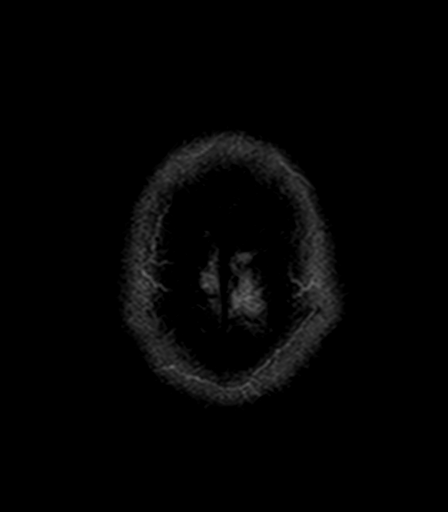

[Series 14: T2 · coronal · 5.0mm · 0.72mm/px · 3 of 24 slices shown (2 of 2)]
[im 1/24]
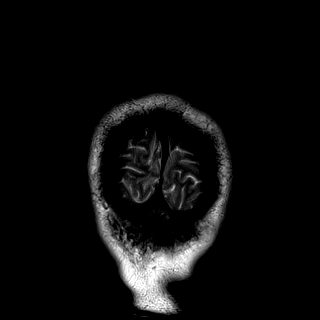
[im 12/24]
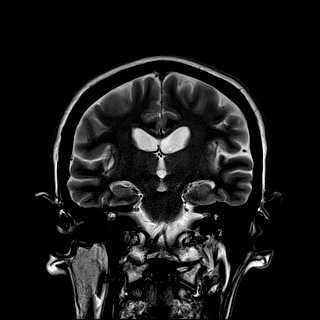
[im 24/24]
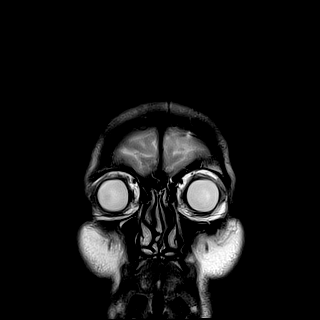

[Series 16: T1 post-contrast · coronal · 5.0mm · 0.34mm/px · 3 of 26 slices shown]
[im 1/26]
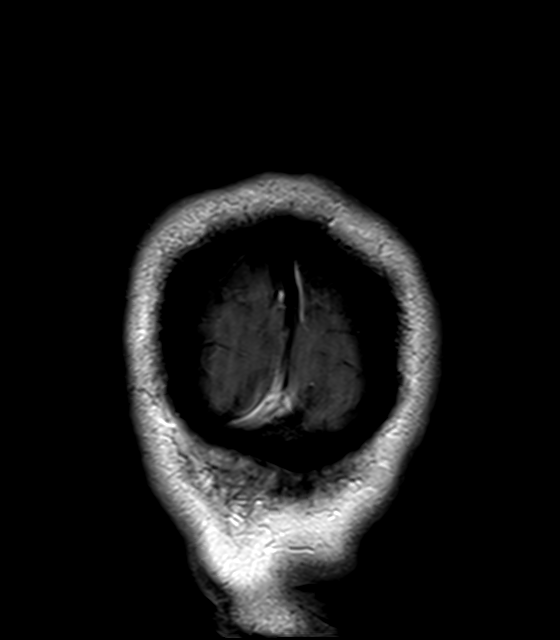
[im 13/26]
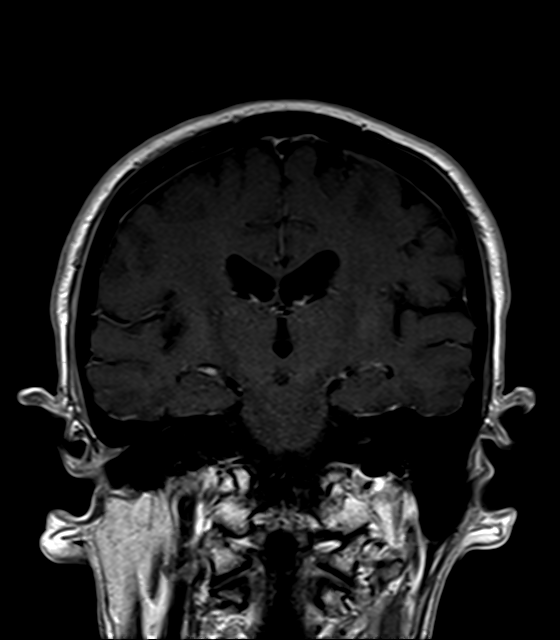
[im 26/26]
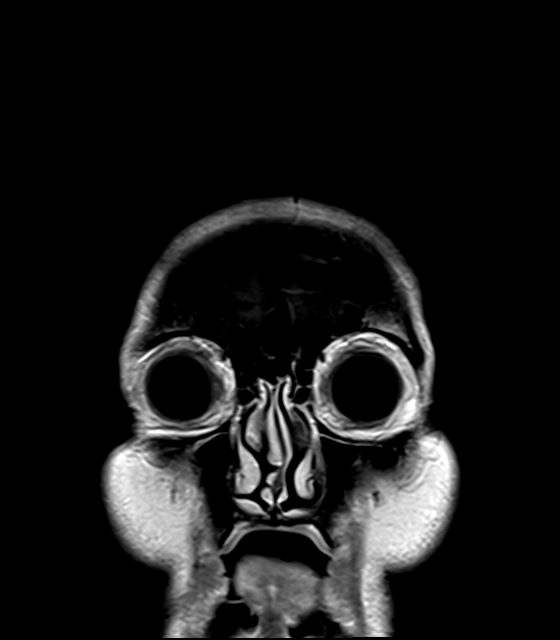

[36 of 48 positions shown; findings below may reference images not displayed]

FINDINGS: Brain: No acute infarction, hemorrhage, hydrocephalus, extra-axial
collection or mass lesion. Similar suspected small intraventricular
adhesion near the right frontal horn (series 10, image 13). No
abnormal enhancement. Previously described area of T2/FLAIR
hyperintensity in the left cerebellar peduncle is stable and
nonenhancing. Additional mild scattered T2/FLAIR hyperintensities in
the white matter are most likely the sequela of chronic
microvascular ischemic disease

Vascular: Major arterial flow voids are maintained at the skull
base.

Skull and upper cervical spine: Normal marrow signal.

Sinuses/Orbits: Negative.

Other: Trace left mastoid fluid.
IMPRESSION: 1. No evidence of acute intracranial abnormality. Specifically, no
acute infarct.
2. Previously described area of T2/FLAIR hyperintensity in the left
cerebellar peduncle is stable and nonenhancing, most likely gliosis
from prior insult.

## 2021-02-12 MED ORDER — GADOBUTROL 1 MMOL/ML IV SOLN
7.0000 mL | Freq: Once | INTRAVENOUS | Status: AC | PRN
  Administered 2021-02-12: 7 mL via INTRAVENOUS

## 2021-02-12 NOTE — ED Provider Notes (Signed)
[place] Provider Note   CSN: [id number] Arrival date & time: 02/12/21  1236     History Chief Complaint  Patient presents with   Aphasia    Samuel Barr is a 70 y.o. male.  He has a history of carotid dissection and tonsillar cancer.  He said today while eating and drinking lunch she experienced about a minutes worth of tingling in his left tongue and jaw along with possibly some slurred speech.  Symptoms were completely resolved.  No headache blurry vision double vision numbness or weakness anywhere else.  He had a similar experience 2 years ago and had an extensive stroke work-up.  He saw Dr. Garen Grams and also a specialist at [facility].  Ultimately they never found a cause of his symptoms.  He says he has intermittent spasms while eating and drinking from his radiation.  The history is provided by the patient.  Cerebrovascular Accident This is a recurrent problem. The current episode started 3 to 5 hours ago. The problem has been resolved. Pertinent negatives include no chest pain, no abdominal pain, no headaches and no shortness of breath. Nothing aggravates the symptoms. Nothing relieves the symptoms. He has tried nothing for the symptoms. The treatment provided significant relief.      Past Medical History:  Diagnosis Date   Cancer [facility]) 2008    Patient Active Problem List   Diagnosis Date Noted   Common carotid artery stenosis 08/23/2019   TIA (transient ischemic attack) 08/22/2019    Past Surgical History:  Procedure Laterality Date   EYE SURGERY     HERNIA REPAIR     IR ANGIO INTRA EXTRACRAN SEL COM CAROTID INNOMINATE BILAT MOD SED  08/23/2019   IR ANGIO VERTEBRAL SEL VERTEBRAL BILAT MOD SED  08/23/2019   TONSILLECTOMY Left    cancer       Family History  Problem Relation Age of Onset   Diabetes Mother    Heart attack Father     Social History   Tobacco Use   Smoking status: Former    Pack years: 0.00    Types: Cigarettes     Quit date: 09/08/1979    Years since quitting: 41.4   Smokeless tobacco: Never  Vaping Use   Vaping Use: Never used  Substance Use Topics   Alcohol use: Not Currently    Comment: occasional   Drug use: Yes    Comment: occas    Home Medications Prior to Admission medications   Medication Sig Start Date End Date Taking? Authorizing Provider  amLODipine (NORVASC) 5 MG tablet Take 1 tablet by mouth daily. 11/27/20  Yes [provider]  albuterol (VENTOLIN HFA) 108 (90 Base) MCG/ACT inhaler SMARTSIG:1-2 Puff(s) Via Inhaler Every 4-6 Hours 11/30/20   [provider]  amLODipine (NORVASC) 2.5 MG tablet Take 1 tablet (2.5 mg total) by mouth daily. 08/23/19 09/22/19  Darliss Cheney, MD  aspirin 81 MG chewable tablet Chew 81 mg by mouth daily.    [provider]  atorvastatin (LIPITOR) 40 MG tablet Take 1 tablet (40 mg total) by mouth daily at 6 PM. 08/23/19 09/22/19  Darliss Cheney, MD  clopidogrel (PLAVIX) 75 MG tablet Take 1 tablet by mouth every morning. 11/27/20   [provider]  diazepam (VALIUM) 5 MG tablet Take 5-10 mg by mouth at bedtime as needed. 07/23/19   [provider]  LANTUS SOLOSTAR 100 UNIT/ML Solostar Pen Inject 50-60 Units into the skin at bedtime. 07/30/19   [provider]  levothyroxine (SYNTHROID, LEVOTHROID) 25 MCG tablet Take 25 mcg by mouth daily before breakfast.    [provider]  metFORMIN (GLUCOPHAGE) 500 MG tablet Take 500 mg by mouth 2 (two) times daily with a meal.    [provider]    Allergies    Patient has no known allergies.  Review of Systems   Review of Systems  Constitutional:  Negative for fever.  HENT:  Negative for sore throat.   Eyes:  Negative for visual disturbance.  Respiratory:  Negative for shortness of breath.   Cardiovascular:  Negative for chest pain.  Gastrointestinal:  Negative for abdominal pain.  Genitourinary:  Negative for dysuria.  Musculoskeletal:  Negative for  neck pain.  Skin:  Negative for rash.  Neurological:  Positive for speech difficulty and numbness. Negative for tremors, syncope, weakness and headaches.   Physical Exam Updated Vital Signs BP (!) 147/79 (BP Location: Left Arm)   Pulse 69   Temp 97.9 F (36.6 C) (Oral)   Resp 16   Ht 5\' 10"  (1.778 m)   Wt 81.2 kg   SpO2 100%   BMI 25.68 kg/m   Physical Exam Vitals and nursing note reviewed.  Constitutional:      Appearance: Normal appearance. He is well-developed.  HENT:     Head: Normocephalic and atraumatic.  Eyes:     Conjunctiva/sclera: Conjunctivae normal.  Cardiovascular:     Rate and Rhythm: Normal rate and regular rhythm.     Heart sounds: No murmur heard. Pulmonary:     Effort: Pulmonary effort is normal. No respiratory distress.     Breath sounds: Normal breath sounds.  Abdominal:     Palpations: Abdomen is soft.     Tenderness: There is no abdominal tenderness.  Musculoskeletal:        General: No deformity or signs of injury. Normal range of motion.     Cervical back: Neck supple.  Skin:    General: Skin is warm and dry.     Capillary Refill: Capillary refill takes less than 2 seconds.  Neurological:     General: No focal deficit present.     Mental Status: He is alert and oriented to person, place, and time.     Cranial Nerves: No cranial nerve deficit.     Sensory: No sensory deficit.     Motor: No weakness.     Gait: Gait normal.    ED Results / Procedures / Treatments   Labs (all labs ordered are listed, but only abnormal results are displayed) Labs Reviewed  BASIC METABOLIC PANEL - Abnormal; Notable for the following components:      Result Value   Glucose, Bld 356 (*)    All other components within normal limits  CBC WITH DIFFERENTIAL/PLATELET  PROTIME-INR  TROPONIN I (HIGH SENSITIVITY)  TROPONIN I (HIGH SENSITIVITY)    EKG EKG Interpretation  Date/Time:  Thursday February 12 2021 12:46:58 EDT Ventricular Rate:  72 PR  Interval:  170 QRS Duration: 84 QT Interval:  396 QTC Calculation: 433 R Axis:   -31 Text Interpretation: Normal sinus rhythm Left axis deviation Abnormal ECG No significant change since prior 12/20 Confirmed by Aletta Edouard [phone]) on 02/12/2021 3:48:09 PM  Radiology MR Brain W and Wo Contrast  Result Date: 02/12/2021 CLINICAL DATA:  Neuro deficit, acute stroke suspected. EXAM: MRI HEAD WITHOUT AND WITH CONTRAST TECHNIQUE: Multiplanar, multiecho pulse sequences of the brain and surrounding structures were obtained without and with intravenous contrast. CONTRAST:  78mL GADAVIST GADOBUTROL 1 MMOL/ML IV SOLN COMPARISON:  MRI 08/22/2019. FINDINGS: Brain: No acute infarction, hemorrhage, hydrocephalus, extra-axial collection or mass lesion. Similar suspected small intraventricular adhesion near the right frontal horn (series 10, image 13). No abnormal enhancement. Previously described area of T2/FLAIR hyperintensity in the left cerebellar peduncle is stable and nonenhancing. Additional mild scattered T2/FLAIR hyperintensities in the white matter are most likely the sequela of chronic microvascular ischemic disease Vascular: Major arterial flow voids are maintained at the skull base. Skull and upper cervical spine: Normal marrow signal. Sinuses/Orbits: Negative. Other: Trace left mastoid fluid. IMPRESSION: 1. No evidence of acute intracranial abnormality. Specifically, no acute infarct. 2. Previously described area of T2/FLAIR hyperintensity in the left cerebellar peduncle is stable and nonenhancing, most likely gliosis from prior insult. Electronically Signed   By: Margaretha Sheffield MD   On: 02/12/2021 15:36    Procedures Procedures   Medications Ordered in ED Medications  gadobutrol (GADAVIST) 1 MMOL/ML injection 7 mL (7 mLs Intravenous Contrast Given 02/12/21 1452)    ED Course  I have reviewed the triage vital signs and the nursing notes.  Pertinent labs & imaging results that were available  during my care of the patient were reviewed by me and considered in my medical decision making (see chart for details).  Clinical Course as of 02/13/21 1118  Thu Feb 12, 2021  1635 Patient's work-up here has been fairly unremarkable.  MRI does not show any acute findings.  Delta troponins flat.  Glucose is elevated at 356.  Known diabetic.  Currently no indications for admission.  Patient has an extensive work-up in the past and is on dual platelet therapy.  Recommended outpatient follow-up with his neurologist. [MB]    Clinical Course User Index [MB] Hayden Rasmussen, MD   MDM Rules/Calculators/A&P                         This patient complains of brief episode of tingling tongue and mouth along with possible garbled speech; this involves an extensive number of treatment Options and is a complaint that carries with it a high risk of complications and Morbidity. The differential includes TIA, stroke, muscle spasm, metabolic derangement, hypoglycemia  I ordered, reviewed and interpreted labs, which included CBC with normal white count normal hemoglobin, chemistries normal other than elevated glucose, troponins flat I ordered imaging studies which included MRI brain and I independently    visualized and interpreted imaging which showed no acute findings  Previous records obtained and reviewed in epic, no recent admissions  After the interventions stated above, I reevaluated the patient and found patient to be asymptomatic and at baseline.  Hemodynamically stable.  I reviewed his work-up with him.  He is comfortable plan for outpatient follow-up with neurology.  He is to continue his dual platelet agents.  Return instructions discussed   Final Clinical Impression(s) / ED Diagnoses Final diagnoses:  Paresthesia  Slurred speech    Rx / DC Orders ED Discharge Orders     None        Hayden Rasmussen, MD 02/13/21 1120

## 2021-02-12 NOTE — Discharge Instructions (Addendum)
You were seen in the emergency department for an episode of numbness around your mouth and jaw along with possibly some slurred speech.  You had lab work that showed your blood sugar was elevated.  You had an MRI of your brain that did not show any acute stroke.  Please contact your PCP and your neurologist for close follow-up.  Continue your regular medications.  Return to the emergency department if any worsening or concerning symptoms.

## 2021-02-12 NOTE — ED Provider Notes (Signed)
Emergency Medicine Provider Triage Evaluation Note  Samuel Barr , a 70 y.o. male  was evaluated in triage.  Past medical history for carotid artery dissection and tonsillar cancer.  He here for evaluation of possible stroke.  Last known well at 5 30-11 26.  He noticed some "drawing" to his left face and slurred speech.  Symptoms lasted 1 to 2 minutes.  Denies symptoms at present.  Has history of radiation for cancer of the tonsil states his current symptoms feel similar to TIA episode that happened in 2020.  Review of Systems  Positive: Left facial "drawing" and slurred speech Negative: Headache, dizziness, left-sided facial weakness or extremity weakness  Physical Exam  BP (!) 157/80 (BP Location: Right Arm)   Pulse 76   Temp 97.9 F (36.6 C) (Oral)   Resp 18   Ht 5\' 10"  (1.778 m)   Wt 81.2 kg   SpO2 98%   BMI 25.68 kg/m  Gen:   Awake, no distress   Resp:  Normal effort, lungs clear to auscultation bilaterally MSK:   Moves extremities without difficulty, ambulatory gait steady. Other:  Negative Romberg, normal finger-nose testing, no facial droop or pronator drift.  Medical Decision Making  Medically screening exam initiated at 1:08 PM.  Appropriate orders placed.  Samuel Barr was informed that the remainder of the evaluation will be completed by another provider, this initial triage assessment does not replace that evaluation, and the importance of remaining in the ED until their evaluation is complete.  Patient with history of tonsillar cancer and prior TIA anticoagulated with Plavix and aspirin.  Brief episode of dysarthria this morning at 1130.  Back to baseline at present.  Spoke with teleneurology, Dr. Verna Czech got who recommended MRI brain with and without.  He will need further evaluation emergency department, patient agreeable to plan.   Kem Parkinson, PA-C 02/12/21 [place], MD 02/13/21 [phone]

## 2021-02-12 NOTE — ED Triage Notes (Signed)
Pt with slurred speech with numbness to right side of face while eating lunch an hour ago. No slurred speech at present. Hx of radiation back in 2008.  States EMS called and was assessed.  Pt came here for a CT.

## 2021-02-12 NOTE — ED Triage Notes (Signed)
Pt removed a tick from left ear few weeks ago.

## 2021-02-26 DIAGNOSIS — E1140 Type 2 diabetes mellitus with diabetic neuropathy, unspecified: Secondary | ICD-10-CM | POA: Diagnosis not present

## 2021-02-26 DIAGNOSIS — E7849 Other hyperlipidemia: Secondary | ICD-10-CM | POA: Diagnosis not present

## 2021-02-26 DIAGNOSIS — I10 Essential (primary) hypertension: Secondary | ICD-10-CM | POA: Diagnosis not present

## 2021-02-26 DIAGNOSIS — E039 Hypothyroidism, unspecified: Secondary | ICD-10-CM | POA: Diagnosis not present

## 2021-04-08 DIAGNOSIS — Z681 Body mass index (BMI) 19 or less, adult: Secondary | ICD-10-CM | POA: Diagnosis not present

## 2021-04-08 DIAGNOSIS — J069 Acute upper respiratory infection, unspecified: Secondary | ICD-10-CM | POA: Diagnosis not present

## 2021-04-29 DIAGNOSIS — E1140 Type 2 diabetes mellitus with diabetic neuropathy, unspecified: Secondary | ICD-10-CM | POA: Diagnosis not present

## 2021-04-29 DIAGNOSIS — I10 Essential (primary) hypertension: Secondary | ICD-10-CM | POA: Diagnosis not present

## 2021-04-29 DIAGNOSIS — E039 Hypothyroidism, unspecified: Secondary | ICD-10-CM | POA: Diagnosis not present

## 2021-04-29 DIAGNOSIS — E7849 Other hyperlipidemia: Secondary | ICD-10-CM | POA: Diagnosis not present

## 2021-05-05 DIAGNOSIS — E063 Autoimmune thyroiditis: Secondary | ICD-10-CM | POA: Diagnosis not present

## 2021-05-05 DIAGNOSIS — Z0001 Encounter for general adult medical examination with abnormal findings: Secondary | ICD-10-CM | POA: Diagnosis not present

## 2021-05-05 DIAGNOSIS — I10 Essential (primary) hypertension: Secondary | ICD-10-CM | POA: Diagnosis not present

## 2021-05-05 DIAGNOSIS — E1140 Type 2 diabetes mellitus with diabetic neuropathy, unspecified: Secondary | ICD-10-CM | POA: Diagnosis not present

## 2021-05-05 DIAGNOSIS — Z1389 Encounter for screening for other disorder: Secondary | ICD-10-CM | POA: Diagnosis not present

## 2021-05-05 DIAGNOSIS — R011 Cardiac murmur, unspecified: Secondary | ICD-10-CM | POA: Diagnosis not present

## 2021-05-05 DIAGNOSIS — E039 Hypothyroidism, unspecified: Secondary | ICD-10-CM | POA: Diagnosis not present

## 2021-05-05 DIAGNOSIS — Z13220 Encounter for screening for lipoid disorders: Secondary | ICD-10-CM | POA: Diagnosis not present

## 2021-05-29 DIAGNOSIS — E039 Hypothyroidism, unspecified: Secondary | ICD-10-CM | POA: Diagnosis not present

## 2021-05-29 DIAGNOSIS — I10 Essential (primary) hypertension: Secondary | ICD-10-CM | POA: Diagnosis not present

## 2021-05-29 DIAGNOSIS — E782 Mixed hyperlipidemia: Secondary | ICD-10-CM | POA: Diagnosis not present

## 2021-05-29 DIAGNOSIS — E1140 Type 2 diabetes mellitus with diabetic neuropathy, unspecified: Secondary | ICD-10-CM | POA: Diagnosis not present

## 2021-06-09 DIAGNOSIS — Z23 Encounter for immunization: Secondary | ICD-10-CM | POA: Diagnosis not present

## 2021-06-29 DIAGNOSIS — E039 Hypothyroidism, unspecified: Secondary | ICD-10-CM | POA: Diagnosis not present

## 2021-06-29 DIAGNOSIS — I10 Essential (primary) hypertension: Secondary | ICD-10-CM | POA: Diagnosis not present

## 2021-06-29 DIAGNOSIS — E1140 Type 2 diabetes mellitus with diabetic neuropathy, unspecified: Secondary | ICD-10-CM | POA: Diagnosis not present

## 2021-06-29 DIAGNOSIS — E782 Mixed hyperlipidemia: Secondary | ICD-10-CM | POA: Diagnosis not present

## 2021-07-29 DIAGNOSIS — E1140 Type 2 diabetes mellitus with diabetic neuropathy, unspecified: Secondary | ICD-10-CM | POA: Diagnosis not present

## 2021-07-29 DIAGNOSIS — E039 Hypothyroidism, unspecified: Secondary | ICD-10-CM | POA: Diagnosis not present

## 2021-07-29 DIAGNOSIS — E782 Mixed hyperlipidemia: Secondary | ICD-10-CM | POA: Diagnosis not present

## 2021-07-29 DIAGNOSIS — I10 Essential (primary) hypertension: Secondary | ICD-10-CM | POA: Diagnosis not present

## 2021-07-30 ENCOUNTER — Encounter: Payer: Self-pay | Admitting: Cardiology

## 2021-07-30 ENCOUNTER — Ambulatory Visit: Payer: Medicare Other | Admitting: Cardiology

## 2021-07-30 VITALS — BP 136/78 | HR 72 | Ht 70.0 in | Wt 184.0 lb

## 2021-07-30 DIAGNOSIS — R011 Cardiac murmur, unspecified: Secondary | ICD-10-CM

## 2021-07-30 NOTE — Progress Notes (Signed)
Clinical Summary Samuel Barr is a 70 y.o.male seen today as a new patient, last seen in our office 05/2018.        1. 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 Followed at [facility]  2. Heart murmur 07/2019 echo LVEF 60-65%, no WMAs, mild aortic sclerosis without stenosis - no recent SOB/DOE, no cardiac chest pains.   3. 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 30 tablet 0   amLODipine (NORVASC) 5 MG tablet Take 1 tablet by mouth daily.     atorvastatin (LIPITOR) 40 MG tablet Take 1 tablet (40 mg total) by mouth daily at 6 PM. 30 tablet 0   clopidogrel (PLAVIX) 75 MG tablet Take 1 tablet by mouth every morning.     LANTUS SOLOSTAR 100 UNIT/ML Solostar Pen Inject 60 Units into the skin at bedtime.     levothyroxine (SYNTHROID, LEVOTHROID) 25 MCG tablet Take 25 mcg by mouth daily before breakfast.     metFORMIN (GLUCOPHAGE) 500 MG tablet Take 500 mg by mouth 2 (two) times daily with a meal.     No current facility-administered medications for this visit.     Past Surgical History:  Procedure Laterality Date   EYE SURGERY     HERNIA REPAIR     IR ANGIO INTRA EXTRACRAN SEL COM CAROTID INNOMINATE BILAT MOD SED  08/23/2019   IR ANGIO VERTEBRAL SEL VERTEBRAL BILAT MOD SED  08/23/2019   TONSILLECTOMY Left    cancer     No Known Allergies    Family  History  Problem Relation Age of Onset   Diabetes Mother    Heart attack Father      Social History Mr. Thoma reports that he quit smoking about 41 years ago. He has never used smokeless tobacco. Mr. Azam reports that he does not currently use alcohol.   Review of Systems CONSTITUTIONAL: No weight loss, fever, chills, weakness or fatigue.  HEENT: Eyes: No visual loss, blurred vision, double vision or yellow sclerae.No hearing loss, sneezing, congestion, runny nose or sore throat.  SKIN: No rash or itching.  CARDIOVASCULAR: per hpi RESPIRATORY: No shortness of breath, cough or sputum.  GASTROINTESTINAL: No anorexia, nausea, vomiting or diarrhea. No abdominal pain or blood.  GENITOURINARY: No burning on urination, no polyuria NEUROLOGICAL: No headache, dizziness, syncope, paralysis, ataxia, numbness or tingling in the extremities. No change in bowel or bladder control.  MUSCULOSKELETAL: No muscle, back pain, joint pain or stiffness.  LYMPHATICS: No enlarged nodes. No history of splenectomy.  PSYCHIATRIC: No history of depression or anxiety.  ENDOCRINOLOGIC: No reports of sweating, cold or heat intolerance. No polyuria or polydipsia.  Marland Kitchen   Physical Examination Today's Vitals   07/30/21 0845  BP: 136/78  Pulse: 72  SpO2: 98%  Weight: 184 lb (83.5 kg)  Height: 5\' 10"  (1.778 m)   Body mass index is 26.4 kg/m.  Gen: resting comfortably, no acute distress HEENT: no scleral icterus, pupils equal round and reactive, no palptable cervical adenopathy,  CV: RRR, 3/6 systoilc murmur rusb, no jvd Resp: Clear to auscultation bilaterally GI: abdomen is soft, non-tender, non-distended, normal bowel sounds, no hepatosplenomegaly MSK: extremities are warm, no edema.  Skin: warm, no rash Neuro:  no focal deficits Psych: appropriate affect   Diagnostic Studies  2003 cath RESULTS:   HEMODYNAMICS: Left ventricular pressure 125/15. Aortic pressure 112/66.  There was no aortic  valve gradient.   LEFT VENTRICULOGRAM: Wall motion is normal. Ejection fraction calculated at 63%. There is no mitral regurgitation.   CORONARY ARTERIOGRAPHY: (Right dominant).   Left main has an ostial 20% stenosis.   Left anterior descending artery is a relatively small vessel. It gives rise to a small first diagonal and a normal sized second diagonal Jennie Bolar. The LAD is normal.   Left circumflex is a small vessel. It gives rise to a small OM-1 and a small OM-2. The left circumflex is normal.   Right coronary artery is a very large super dominant vessel. There is a 20% stenosis in the ostium of the right coronary artery. The right coronary artery is otherwise angiographically normal. The distal right coronary artery gives rise to a normal sized posterior descending artery, small first and second posterolateral branches, large third posterolateral Bryleigh Ottaway, small forth posterolateral Bebe Moncure, and a normal sized fifth posterolateral Randen Kauth.   IMPRESSIONS: 1. Normal left ventricular systolic function. 2. No significant coronary artery disease identified.     Assessment and Plan   1. Heart murmur - murmur suggestive of aortic stenosis. From 2020 echo did have some aortic sclerosis at that time - will repeat echo to reassess any progression of valvular disease  F/u pending echo results   Arnoldo Lenis, M.D.

## 2021-07-30 NOTE — Patient Instructions (Addendum)
Medication Instructions:  Continue all current medications.  Labwork: none  Testing/Procedures:  Your physician has requested that you have an echocardiogram. Echocardiography is a painless test that uses sound waves to create images of your heart. It provides your doctor with information about the size and shape of your heart and how well your heart's chambers and valves are working. This procedure takes approximately one hour. There are no restrictions for this procedure.  Office will contact with results via phone or letter.    Follow-Up: Pending test results.  Any Other Special Instructions Will Be Listed Below (If Applicable).  If you need a refill on your cardiac medications before your next appointment, please call your pharmacy.  

## 2021-08-11 DIAGNOSIS — E11319 Type 2 diabetes mellitus with unspecified diabetic retinopathy without macular edema: Secondary | ICD-10-CM | POA: Diagnosis not present

## 2021-08-11 DIAGNOSIS — H40053 Ocular hypertension, bilateral: Secondary | ICD-10-CM | POA: Diagnosis not present

## 2021-08-11 DIAGNOSIS — H2513 Age-related nuclear cataract, bilateral: Secondary | ICD-10-CM | POA: Diagnosis not present

## 2021-08-11 DIAGNOSIS — H31012 Macula scars of posterior pole (postinflammatory) (post-traumatic), left eye: Secondary | ICD-10-CM | POA: Diagnosis not present

## 2021-08-28 DIAGNOSIS — E1140 Type 2 diabetes mellitus with diabetic neuropathy, unspecified: Secondary | ICD-10-CM | POA: Diagnosis not present

## 2021-08-28 DIAGNOSIS — I10 Essential (primary) hypertension: Secondary | ICD-10-CM | POA: Diagnosis not present

## 2021-08-28 DIAGNOSIS — E039 Hypothyroidism, unspecified: Secondary | ICD-10-CM | POA: Diagnosis not present

## 2021-08-28 DIAGNOSIS — E782 Mixed hyperlipidemia: Secondary | ICD-10-CM | POA: Diagnosis not present

## 2021-09-07 ENCOUNTER — Ambulatory Visit (INDEPENDENT_AMBULATORY_CARE_PROVIDER_SITE_OTHER): Payer: Medicare Other

## 2021-09-07 DIAGNOSIS — R011 Cardiac murmur, unspecified: Secondary | ICD-10-CM | POA: Diagnosis not present

## 2021-09-07 LAB — ECHOCARDIOGRAM COMPLETE
AR max vel: 1.58 cm2
AV Area VTI: 1.74 cm2
AV Area mean vel: 1.59 cm2
AV Mean grad: 8.4 mmHg
AV Peak grad: 17.7 mmHg
Ao pk vel: 2.1 m/s
Area-P 1/2: 3.56 cm2
MV M vel: 4.17 m/s
MV Peak grad: 69.6 mmHg
S' Lateral: 2.86 cm

## 2021-09-22 DIAGNOSIS — H8110 Benign paroxysmal vertigo, unspecified ear: Secondary | ICD-10-CM | POA: Diagnosis not present

## 2021-09-22 DIAGNOSIS — E063 Autoimmune thyroiditis: Secondary | ICD-10-CM | POA: Diagnosis not present

## 2021-09-22 DIAGNOSIS — H9313 Tinnitus, bilateral: Secondary | ICD-10-CM | POA: Diagnosis not present

## 2021-09-22 DIAGNOSIS — I10 Essential (primary) hypertension: Secondary | ICD-10-CM | POA: Diagnosis not present

## 2021-09-22 DIAGNOSIS — E1140 Type 2 diabetes mellitus with diabetic neuropathy, unspecified: Secondary | ICD-10-CM | POA: Diagnosis not present

## 2021-09-23 ENCOUNTER — Telehealth: Payer: Self-pay | Admitting: *Deleted

## 2021-09-23 NOTE — Telephone Encounter (Signed)
-----   Message from Arnoldo Lenis, MD sent at 09/23/2021  2:57 PM EST ----- Echo looks good, normal heart function. Heart valves are a little thicker than normal which occurs with aging however they are still working very well and not of concern.Can f/u 2 years  Zandra Abts MD

## 2021-09-23 NOTE — Telephone Encounter (Signed)
Patient informed. Copy sent to PCP °

## 2021-10-09 DIAGNOSIS — H18413 Arcus senilis, bilateral: Secondary | ICD-10-CM | POA: Diagnosis not present

## 2021-10-09 DIAGNOSIS — H25043 Posterior subcapsular polar age-related cataract, bilateral: Secondary | ICD-10-CM | POA: Diagnosis not present

## 2021-10-09 DIAGNOSIS — H2512 Age-related nuclear cataract, left eye: Secondary | ICD-10-CM | POA: Diagnosis not present

## 2021-10-09 DIAGNOSIS — H25013 Cortical age-related cataract, bilateral: Secondary | ICD-10-CM | POA: Diagnosis not present

## 2021-10-09 DIAGNOSIS — H2513 Age-related nuclear cataract, bilateral: Secondary | ICD-10-CM | POA: Diagnosis not present

## 2021-10-23 DIAGNOSIS — I10 Essential (primary) hypertension: Secondary | ICD-10-CM | POA: Diagnosis not present

## 2021-10-23 DIAGNOSIS — E1140 Type 2 diabetes mellitus with diabetic neuropathy, unspecified: Secondary | ICD-10-CM | POA: Diagnosis not present

## 2021-10-23 DIAGNOSIS — Z6824 Body mass index (BMI) 24.0-24.9, adult: Secondary | ICD-10-CM | POA: Diagnosis not present

## 2021-10-23 DIAGNOSIS — E063 Autoimmune thyroiditis: Secondary | ICD-10-CM | POA: Diagnosis not present

## 2021-10-23 DIAGNOSIS — J329 Chronic sinusitis, unspecified: Secondary | ICD-10-CM | POA: Diagnosis not present

## 2021-11-15 DIAGNOSIS — J029 Acute pharyngitis, unspecified: Secondary | ICD-10-CM | POA: Diagnosis not present

## 2021-11-15 DIAGNOSIS — R059 Cough, unspecified: Secondary | ICD-10-CM | POA: Diagnosis not present

## 2021-11-15 DIAGNOSIS — Z03818 Encounter for observation for suspected exposure to other biological agents ruled out: Secondary | ICD-10-CM | POA: Diagnosis not present

## 2021-11-27 DIAGNOSIS — I10 Essential (primary) hypertension: Secondary | ICD-10-CM | POA: Diagnosis not present

## 2021-11-27 DIAGNOSIS — E039 Hypothyroidism, unspecified: Secondary | ICD-10-CM | POA: Diagnosis not present

## 2021-11-27 DIAGNOSIS — E782 Mixed hyperlipidemia: Secondary | ICD-10-CM | POA: Diagnosis not present

## 2021-11-27 DIAGNOSIS — E1140 Type 2 diabetes mellitus with diabetic neuropathy, unspecified: Secondary | ICD-10-CM | POA: Diagnosis not present

## 2021-12-08 DIAGNOSIS — I7771 Dissection of carotid artery: Secondary | ICD-10-CM | POA: Diagnosis not present

## 2021-12-08 DIAGNOSIS — Z85818 Personal history of malignant neoplasm of other sites of lip, oral cavity, and pharynx: Secondary | ICD-10-CM | POA: Diagnosis not present

## 2021-12-21 DIAGNOSIS — H2512 Age-related nuclear cataract, left eye: Secondary | ICD-10-CM | POA: Diagnosis not present

## 2021-12-21 DIAGNOSIS — H2513 Age-related nuclear cataract, bilateral: Secondary | ICD-10-CM | POA: Diagnosis not present

## 2021-12-22 DIAGNOSIS — H2511 Age-related nuclear cataract, right eye: Secondary | ICD-10-CM | POA: Diagnosis not present

## 2021-12-27 DIAGNOSIS — E782 Mixed hyperlipidemia: Secondary | ICD-10-CM | POA: Diagnosis not present

## 2021-12-27 DIAGNOSIS — E039 Hypothyroidism, unspecified: Secondary | ICD-10-CM | POA: Diagnosis not present

## 2021-12-27 DIAGNOSIS — I10 Essential (primary) hypertension: Secondary | ICD-10-CM | POA: Diagnosis not present

## 2021-12-27 DIAGNOSIS — E1140 Type 2 diabetes mellitus with diabetic neuropathy, unspecified: Secondary | ICD-10-CM | POA: Diagnosis not present

## 2022-01-08 DIAGNOSIS — H903 Sensorineural hearing loss, bilateral: Secondary | ICD-10-CM | POA: Diagnosis not present

## 2022-01-08 DIAGNOSIS — H9313 Tinnitus, bilateral: Secondary | ICD-10-CM | POA: Diagnosis not present

## 2022-01-08 DIAGNOSIS — H9203 Otalgia, bilateral: Secondary | ICD-10-CM | POA: Diagnosis not present

## 2022-01-18 DIAGNOSIS — H2513 Age-related nuclear cataract, bilateral: Secondary | ICD-10-CM | POA: Diagnosis not present

## 2022-01-18 DIAGNOSIS — H2511 Age-related nuclear cataract, right eye: Secondary | ICD-10-CM | POA: Diagnosis not present

## 2022-01-27 DIAGNOSIS — I10 Essential (primary) hypertension: Secondary | ICD-10-CM | POA: Diagnosis not present

## 2022-01-27 DIAGNOSIS — E1140 Type 2 diabetes mellitus with diabetic neuropathy, unspecified: Secondary | ICD-10-CM | POA: Diagnosis not present

## 2022-01-27 DIAGNOSIS — E782 Mixed hyperlipidemia: Secondary | ICD-10-CM | POA: Diagnosis not present

## 2022-01-27 DIAGNOSIS — E039 Hypothyroidism, unspecified: Secondary | ICD-10-CM | POA: Diagnosis not present

## 2022-02-17 DIAGNOSIS — H16223 Keratoconjunctivitis sicca, not specified as Sjogren's, bilateral: Secondary | ICD-10-CM | POA: Diagnosis not present

## 2022-02-25 DIAGNOSIS — Z961 Presence of intraocular lens: Secondary | ICD-10-CM | POA: Diagnosis not present

## 2022-03-11 DIAGNOSIS — E063 Autoimmune thyroiditis: Secondary | ICD-10-CM | POA: Diagnosis not present

## 2022-03-11 DIAGNOSIS — I10 Essential (primary) hypertension: Secondary | ICD-10-CM | POA: Diagnosis not present

## 2022-03-11 DIAGNOSIS — E1140 Type 2 diabetes mellitus with diabetic neuropathy, unspecified: Secondary | ICD-10-CM | POA: Diagnosis not present

## 2022-03-11 DIAGNOSIS — E1165 Type 2 diabetes mellitus with hyperglycemia: Secondary | ICD-10-CM | POA: Diagnosis not present

## 2022-03-16 DIAGNOSIS — E1165 Type 2 diabetes mellitus with hyperglycemia: Secondary | ICD-10-CM | POA: Diagnosis not present

## 2022-03-18 DIAGNOSIS — H3581 Retinal edema: Secondary | ICD-10-CM | POA: Diagnosis not present

## 2022-03-30 DIAGNOSIS — H43821 Vitreomacular adhesion, right eye: Secondary | ICD-10-CM | POA: Diagnosis not present

## 2022-03-30 DIAGNOSIS — E113292 Type 2 diabetes mellitus with mild nonproliferative diabetic retinopathy without macular edema, left eye: Secondary | ICD-10-CM | POA: Diagnosis not present

## 2022-03-30 DIAGNOSIS — E113391 Type 2 diabetes mellitus with moderate nonproliferative diabetic retinopathy without macular edema, right eye: Secondary | ICD-10-CM | POA: Diagnosis not present

## 2022-03-30 DIAGNOSIS — H59031 Cystoid macular edema following cataract surgery, right eye: Secondary | ICD-10-CM | POA: Diagnosis not present

## 2022-03-30 DIAGNOSIS — H35033 Hypertensive retinopathy, bilateral: Secondary | ICD-10-CM | POA: Diagnosis not present

## 2022-04-16 DIAGNOSIS — E1165 Type 2 diabetes mellitus with hyperglycemia: Secondary | ICD-10-CM | POA: Diagnosis not present

## 2022-05-11 DIAGNOSIS — H59031 Cystoid macular edema following cataract surgery, right eye: Secondary | ICD-10-CM | POA: Diagnosis not present

## 2022-05-11 DIAGNOSIS — H43821 Vitreomacular adhesion, right eye: Secondary | ICD-10-CM | POA: Diagnosis not present

## 2022-05-11 DIAGNOSIS — E113292 Type 2 diabetes mellitus with mild nonproliferative diabetic retinopathy without macular edema, left eye: Secondary | ICD-10-CM | POA: Diagnosis not present

## 2022-05-11 DIAGNOSIS — E113391 Type 2 diabetes mellitus with moderate nonproliferative diabetic retinopathy without macular edema, right eye: Secondary | ICD-10-CM | POA: Diagnosis not present

## 2022-05-17 DIAGNOSIS — E1165 Type 2 diabetes mellitus with hyperglycemia: Secondary | ICD-10-CM | POA: Diagnosis not present

## 2022-06-14 DIAGNOSIS — E1165 Type 2 diabetes mellitus with hyperglycemia: Secondary | ICD-10-CM | POA: Diagnosis not present

## 2022-06-14 DIAGNOSIS — I10 Essential (primary) hypertension: Secondary | ICD-10-CM | POA: Diagnosis not present

## 2022-06-14 DIAGNOSIS — Z0001 Encounter for general adult medical examination with abnormal findings: Secondary | ICD-10-CM | POA: Diagnosis not present

## 2022-06-14 DIAGNOSIS — E063 Autoimmune thyroiditis: Secondary | ICD-10-CM | POA: Diagnosis not present

## 2022-06-14 DIAGNOSIS — D518 Other vitamin B12 deficiency anemias: Secondary | ICD-10-CM | POA: Diagnosis not present

## 2022-06-14 DIAGNOSIS — I358 Other nonrheumatic aortic valve disorders: Secondary | ICD-10-CM | POA: Diagnosis not present

## 2022-06-14 DIAGNOSIS — C329 Malignant neoplasm of larynx, unspecified: Secondary | ICD-10-CM | POA: Diagnosis not present

## 2022-06-16 DIAGNOSIS — E1165 Type 2 diabetes mellitus with hyperglycemia: Secondary | ICD-10-CM | POA: Diagnosis not present

## 2022-07-08 DIAGNOSIS — J069 Acute upper respiratory infection, unspecified: Secondary | ICD-10-CM | POA: Diagnosis not present

## 2022-07-16 DIAGNOSIS — H43821 Vitreomacular adhesion, right eye: Secondary | ICD-10-CM | POA: Diagnosis not present

## 2022-07-16 DIAGNOSIS — E113391 Type 2 diabetes mellitus with moderate nonproliferative diabetic retinopathy without macular edema, right eye: Secondary | ICD-10-CM | POA: Diagnosis not present

## 2022-07-16 DIAGNOSIS — H35033 Hypertensive retinopathy, bilateral: Secondary | ICD-10-CM | POA: Diagnosis not present

## 2022-07-16 DIAGNOSIS — E113292 Type 2 diabetes mellitus with mild nonproliferative diabetic retinopathy without macular edema, left eye: Secondary | ICD-10-CM | POA: Diagnosis not present

## 2022-08-31 DIAGNOSIS — H903 Sensorineural hearing loss, bilateral: Secondary | ICD-10-CM | POA: Diagnosis not present

## 2022-08-31 DIAGNOSIS — H9122 Sudden idiopathic hearing loss, left ear: Secondary | ICD-10-CM | POA: Diagnosis not present

## 2022-09-06 DIAGNOSIS — H903 Sensorineural hearing loss, bilateral: Secondary | ICD-10-CM | POA: Diagnosis not present

## 2022-09-06 DIAGNOSIS — H9122 Sudden idiopathic hearing loss, left ear: Secondary | ICD-10-CM | POA: Diagnosis not present

## 2022-09-24 DIAGNOSIS — E1165 Type 2 diabetes mellitus with hyperglycemia: Secondary | ICD-10-CM | POA: Diagnosis not present

## 2022-09-28 DIAGNOSIS — D518 Other vitamin B12 deficiency anemias: Secondary | ICD-10-CM | POA: Diagnosis not present

## 2022-09-28 DIAGNOSIS — E559 Vitamin D deficiency, unspecified: Secondary | ICD-10-CM | POA: Diagnosis not present

## 2022-09-28 DIAGNOSIS — Z125 Encounter for screening for malignant neoplasm of prostate: Secondary | ICD-10-CM | POA: Diagnosis not present

## 2022-09-28 DIAGNOSIS — I10 Essential (primary) hypertension: Secondary | ICD-10-CM | POA: Diagnosis not present

## 2022-09-28 DIAGNOSIS — E782 Mixed hyperlipidemia: Secondary | ICD-10-CM | POA: Diagnosis not present

## 2022-09-28 DIAGNOSIS — E1140 Type 2 diabetes mellitus with diabetic neuropathy, unspecified: Secondary | ICD-10-CM | POA: Diagnosis not present

## 2022-09-29 DIAGNOSIS — I10 Essential (primary) hypertension: Secondary | ICD-10-CM | POA: Diagnosis not present

## 2022-09-29 DIAGNOSIS — E1165 Type 2 diabetes mellitus with hyperglycemia: Secondary | ICD-10-CM | POA: Diagnosis not present

## 2022-10-28 DIAGNOSIS — E1165 Type 2 diabetes mellitus with hyperglycemia: Secondary | ICD-10-CM | POA: Diagnosis not present

## 2022-10-28 DIAGNOSIS — I10 Essential (primary) hypertension: Secondary | ICD-10-CM | POA: Diagnosis not present

## 2022-11-16 DIAGNOSIS — H35033 Hypertensive retinopathy, bilateral: Secondary | ICD-10-CM | POA: Diagnosis not present

## 2022-11-16 DIAGNOSIS — H43821 Vitreomacular adhesion, right eye: Secondary | ICD-10-CM | POA: Diagnosis not present

## 2022-11-16 DIAGNOSIS — E113292 Type 2 diabetes mellitus with mild nonproliferative diabetic retinopathy without macular edema, left eye: Secondary | ICD-10-CM | POA: Diagnosis not present

## 2022-11-16 DIAGNOSIS — E113391 Type 2 diabetes mellitus with moderate nonproliferative diabetic retinopathy without macular edema, right eye: Secondary | ICD-10-CM | POA: Diagnosis not present

## 2022-11-28 DIAGNOSIS — E1165 Type 2 diabetes mellitus with hyperglycemia: Secondary | ICD-10-CM | POA: Diagnosis not present

## 2022-11-28 DIAGNOSIS — I10 Essential (primary) hypertension: Secondary | ICD-10-CM | POA: Diagnosis not present

## 2022-12-28 DIAGNOSIS — I10 Essential (primary) hypertension: Secondary | ICD-10-CM | POA: Diagnosis not present

## 2022-12-28 DIAGNOSIS — E1165 Type 2 diabetes mellitus with hyperglycemia: Secondary | ICD-10-CM | POA: Diagnosis not present

## 2022-12-31 DIAGNOSIS — E1165 Type 2 diabetes mellitus with hyperglycemia: Secondary | ICD-10-CM | POA: Diagnosis not present

## 2022-12-31 DIAGNOSIS — E1140 Type 2 diabetes mellitus with diabetic neuropathy, unspecified: Secondary | ICD-10-CM | POA: Diagnosis not present

## 2022-12-31 DIAGNOSIS — Z6825 Body mass index (BMI) 25.0-25.9, adult: Secondary | ICD-10-CM | POA: Diagnosis not present

## 2022-12-31 DIAGNOSIS — N400 Enlarged prostate without lower urinary tract symptoms: Secondary | ICD-10-CM | POA: Diagnosis not present

## 2022-12-31 DIAGNOSIS — I358 Other nonrheumatic aortic valve disorders: Secondary | ICD-10-CM | POA: Diagnosis not present

## 2022-12-31 DIAGNOSIS — I10 Essential (primary) hypertension: Secondary | ICD-10-CM | POA: Diagnosis not present

## 2022-12-31 DIAGNOSIS — C329 Malignant neoplasm of larynx, unspecified: Secondary | ICD-10-CM | POA: Diagnosis not present

## 2023-01-20 DIAGNOSIS — J209 Acute bronchitis, unspecified: Secondary | ICD-10-CM | POA: Diagnosis not present

## 2023-01-27 ENCOUNTER — Ambulatory Visit (INDEPENDENT_AMBULATORY_CARE_PROVIDER_SITE_OTHER): Payer: PPO | Admitting: Dermatology

## 2023-01-27 VITALS — BP 125/62

## 2023-01-27 DIAGNOSIS — L821 Other seborrheic keratosis: Secondary | ICD-10-CM

## 2023-01-27 DIAGNOSIS — W908XXA Exposure to other nonionizing radiation, initial encounter: Secondary | ICD-10-CM

## 2023-01-27 DIAGNOSIS — L814 Other melanin hyperpigmentation: Secondary | ICD-10-CM

## 2023-01-27 DIAGNOSIS — D692 Other nonthrombocytopenic purpura: Secondary | ICD-10-CM

## 2023-01-27 DIAGNOSIS — L578 Other skin changes due to chronic exposure to nonionizing radiation: Secondary | ICD-10-CM

## 2023-01-27 DIAGNOSIS — D1801 Hemangioma of skin and subcutaneous tissue: Secondary | ICD-10-CM | POA: Diagnosis not present

## 2023-01-27 DIAGNOSIS — X32XXXA Exposure to sunlight, initial encounter: Secondary | ICD-10-CM | POA: Diagnosis not present

## 2023-01-27 DIAGNOSIS — D229 Melanocytic nevi, unspecified: Secondary | ICD-10-CM

## 2023-01-27 DIAGNOSIS — L570 Actinic keratosis: Secondary | ICD-10-CM

## 2023-01-27 DIAGNOSIS — L820 Inflamed seborrheic keratosis: Secondary | ICD-10-CM | POA: Diagnosis not present

## 2023-01-27 DIAGNOSIS — Z1283 Encounter for screening for malignant neoplasm of skin: Secondary | ICD-10-CM | POA: Diagnosis not present

## 2023-01-27 NOTE — Progress Notes (Signed)
New Patient Visit   Subjective  Samuel Barr is a 72 y.o. male who presents for the following: Skin Cancer Screening and Full Body Skin Exam - no history of skin cancer  The patient presents for Total-Body Skin Exam (TBSE) for skin cancer screening and mole check. The patient has spots, moles and lesions to be evaluated, some may be new or changing and the patient has concerns that these could be cancer.    The following portions of the chart were reviewed this encounter and updated as appropriate: medications, allergies, medical history  Review of Systems:  No other skin or systemic complaints except as noted in HPI or Assessment and Plan.  Objective  Well appearing patient in no apparent distress; mood and affect are within normal limits.  A full examination was performed including scalp, head, eyes, ears, nose, lips, neck, chest, axillae, abdomen, back, buttocks, bilateral upper extremities, bilateral lower extremities, hands, feet, fingers, toes, fingernails, and toenails. All findings within normal limits unless otherwise noted below.   Relevant physical exam findings are noted in the Assessment and Plan.  Right Ear x 1, left ear x 1, right zygoma x 1, right hand x 3 (6) Erythematous thin papules/macules with gritty scale.   Right Upper Eyelid Margin Erythematous stuck-on, waxy papule or plaque    Assessment & Plan   Purpura - Chronic; persistent and recurrent.  Treatable, but not curable. - Violaceous macules and patches - Benign - Related to trauma, age, sun damage and/or use of blood thinners, chronic use of topical and/or oral steroids - Observe - Can use OTC arnica containing moisturizer such as Dermend Bruise Formula if desired - Call for worsening or other concerns   LENTIGINES, SEBORRHEIC KERATOSES, HEMANGIOMAS - Benign normal skin lesions - Benign-appearing - Call for any changes  MELANOCYTIC NEVI - Tan-brown and/or pink-flesh-colored symmetric macules  and papules - Benign appearing on exam today - Observation - Call clinic for new or changing moles - Recommend daily use of broad spectrum spf 30+ sunscreen to sun-exposed areas.   ACTINIC DAMAGE - Chronic condition, secondary to cumulative UV/sun exposure - diffuse scaly erythematous macules with underlying dyspigmentation - Recommend daily broad spectrum sunscreen SPF 30+ to sun-exposed areas, reapply every 2 hours as needed.  - Staying in the shade or wearing long sleeves, sun glasses (UVA+UVB protection) and wide brim hats (4-inch brim around the entire circumference of the hat) are also recommended for sun protection.  - Call for new or changing lesions.  SKIN CANCER SCREENING PERFORMED TODAY.    AK (actinic keratosis) (6) Right Ear x 1, left ear x 1, right zygoma x 1, right hand x 3  Destruction of lesion - Right Ear x 1, left ear x 1, right zygoma x 1, right hand x 3 Complexity: simple   Destruction method: cryotherapy   Informed consent: discussed and consent obtained   Timeout:  patient name, date of birth, surgical site, and procedure verified Lesion destroyed using liquid nitrogen: Yes   Region frozen until ice ball extended beyond lesion: Yes   Outcome: patient tolerated procedure well with no complications   Post-procedure details: wound care instructions given    Inflamed seborrheic keratosis Right Upper Eyelid Margin  Symptomatic, irritating, patient would like treated.  Benign-appearing.  Call clinic for new or changing lesions.   Prior to procedure, discussed risks of blister formation, small wound, skin dyspigmentation, or rare scar following treatment. Recommend Vaseline ointment to treated areas while healing.  Destruction of lesion - Right Upper Eyelid Margin Complexity: simple   Destruction method: cryotherapy   Informed consent: discussed and consent obtained   Timeout:  patient name, date of birth, surgical site, and procedure verified Lesion  destroyed using liquid nitrogen: Yes   Region frozen until ice ball extended beyond lesion: Yes   Outcome: patient tolerated procedure well with no complications   Post-procedure details: wound care instructions given      Return in about 1 year (around 01/27/2024) for TBSE History of AK.  I, Joanie Coddington, CMA, am acting as scribe for Armida Sans, MD .   Documentation: I have reviewed the above documentation for accuracy and completeness, and I agree with the above.  Armida Sans, MD

## 2023-01-27 NOTE — Patient Instructions (Addendum)
Cryotherapy Aftercare  Wash gently with soap and water everyday.   Apply Vaseline and Band-Aid daily until healed.     Due to recent changes in healthcare laws, you may see results of your pathology and/or laboratory studies on MyChart before the doctors have had a chance to review them. We understand that in some cases there may be results that are confusing or concerning to you. Please understand that not all results are received at the same time and often the doctors may need to interpret multiple results in order to provide you with the best plan of care or course of treatment. Therefore, we ask that you please give us 2 business days to thoroughly review all your results before contacting the office for clarification. Should we see a critical lab result, you will be contacted sooner.   If You Need Anything After Your Visit  If you have any questions or concerns for your doctor, please call our main line at [phone] and press option 4 to reach your doctor's medical assistant. If no one answers, please leave a voicemail as directed and we will return your call as soon as possible. Messages left after 4 pm will be answered the following business day.   You may also send us a message via MyChart. We typically respond to MyChart messages within 1-2 business days.  For prescription refills, please ask your pharmacy to contact our office. Our fax number is [phone].  If you have an urgent issue when the clinic is closed that cannot wait until the next business day, you can page your doctor at the number below.    Please note that while we do our best to be available for urgent issues outside of office hours, we are not available 24/7.   If you have an urgent issue and are unable to reach us, you may choose to seek medical care at your doctor's office, retail clinic, urgent care center, or emergency room.  If you have a medical emergency, please immediately call 911 or go to the  emergency department.  Pager Numbers  - Dr. Kowalski: [phone]  - Dr. Moye: [phone]  - Dr. Stewart: [phone]  In the event of inclement weather, please call our main line at [phone] for an update on the status of any delays or closures.  Dermatology Medication Tips: Please keep the boxes that topical medications come in in order to help keep track of the instructions about where and how to use these. Pharmacies typically print the medication instructions only on the boxes and not directly on the medication tubes.   If your medication is too expensive, please contact our office at [phone] option 4 or send us a message through MyChart.   We are unable to tell what your co-pay for medications will be in advance as this is different depending on your insurance coverage. However, we may be able to find a substitute medication at lower cost or fill out paperwork to get insurance to cover a needed medication.   If a prior authorization is required to get your medication covered by your insurance company, please allow us 1-2 business days to complete this process.  Drug prices often vary depending on where the prescription is filled and some pharmacies may offer cheaper prices.  The website www.goodrx.com contains coupons for medications through different pharmacies. The prices here do not account for what the cost may be with help from insurance (it may be cheaper with your insurance), but the website can   give you the price if you did not use any insurance.  - You can print the associated coupon and take it with your prescription to the pharmacy.  - You may also stop by our office during regular business hours and pick up a GoodRx coupon card.  - If you need your prescription sent electronically to a different pharmacy, notify our office through [facility] MyChart or by phone at [phone] option 4.     Si Usted Necesita Algo Despus de Su Visita  Tambin puede  enviarnos un mensaje a travs de MyChart. Por lo general respondemos a los mensajes de MyChart en el transcurso de 1 a 2 das hbiles.  Para renovar recetas, por favor pida a su farmacia que se ponga en contacto con nuestra oficina. Nuestro nmero de fax es el [phone].  Si tiene un asunto urgente cuando la clnica est cerrada y que no puede esperar hasta el siguiente da hbil, puede llamar/localizar a su doctor(a) al nmero que aparece a continuacin.   Por favor, tenga en cuenta que aunque hacemos todo lo posible para estar disponibles para asuntos urgentes fuera del horario de oficina, no estamos disponibles las 24 horas del da, los 7 das de la semana.   Si tiene un problema urgente y no puede comunicarse con nosotros, puede optar por buscar atencin mdica  en el consultorio de su doctor(a), en una clnica privada, en un centro de atencin urgente o en una sala de emergencias.  Si tiene una emergencia mdica, por favor llame inmediatamente al 911 o vaya a la sala de emergencias.  Nmeros de bper  - Dr. Kowalski: [phone]  - Dra. Moye: [phone]  - Dra. Stewart: [phone]  En caso de inclemencias del tiempo, por favor llame a nuestra lnea principal al [phone] para una actualizacin sobre el estado de cualquier retraso o cierre.  Consejos para la medicacin en dermatologa: Por favor, guarde las cajas en las que vienen los medicamentos de uso tpico para ayudarle a seguir las instrucciones sobre dnde y cmo usarlos. Las farmacias generalmente imprimen las instrucciones del medicamento slo en las cajas y no directamente en los tubos del medicamento.   Si su medicamento es muy caro, por favor, pngase en contacto con nuestra oficina llamando al [phone] y presione la opcin 4 o envenos un mensaje a travs de MyChart.   No podemos decirle cul ser su copago por los medicamentos por adelantado ya que esto es diferente dependiendo de la cobertura de su seguro.  Sin embargo, es posible que podamos encontrar un medicamento sustituto a menor costo o llenar un formulario para que el seguro cubra el medicamento que se considera necesario.   Si se requiere una autorizacin previa para que su compaa de seguros cubra su medicamento, por favor permtanos de 1 a 2 das hbiles para completar este proceso.  Los precios de los medicamentos varan con frecuencia dependiendo del lugar de dnde se surte la receta y alguna farmacias pueden ofrecer precios ms baratos.  El sitio web www.goodrx.com tiene cupones para medicamentos de diferentes farmacias. Los precios aqu no tienen en cuenta lo que podra costar con la ayuda del seguro (puede ser ms barato con su seguro), pero el sitio web puede darle el precio si no utiliz ningn seguro.  - Puede imprimir el cupn correspondiente y llevarlo con su receta a la farmacia.  - Tambin puede pasar por nuestra oficina durante el horario de atencin regular y recoger una tarjeta de cupones de GoodRx.  -   Si necesita que su receta se enve electrnicamente a una farmacia diferente, informe a nuestra oficina a travs de MyChart de [facility] o por telfono llamando al [phone] y presione la opcin 4.  

## 2023-01-28 DIAGNOSIS — E1159 Type 2 diabetes mellitus with other circulatory complications: Secondary | ICD-10-CM | POA: Diagnosis not present

## 2023-01-28 DIAGNOSIS — E039 Hypothyroidism, unspecified: Secondary | ICD-10-CM | POA: Diagnosis not present

## 2023-01-28 DIAGNOSIS — I10 Essential (primary) hypertension: Secondary | ICD-10-CM | POA: Diagnosis not present

## 2023-01-28 DIAGNOSIS — E1165 Type 2 diabetes mellitus with hyperglycemia: Secondary | ICD-10-CM | POA: Diagnosis not present

## 2023-02-08 ENCOUNTER — Encounter: Payer: Self-pay | Admitting: Dermatology

## 2023-02-27 DIAGNOSIS — E1140 Type 2 diabetes mellitus with diabetic neuropathy, unspecified: Secondary | ICD-10-CM | POA: Diagnosis not present

## 2023-02-27 DIAGNOSIS — E1159 Type 2 diabetes mellitus with other circulatory complications: Secondary | ICD-10-CM | POA: Diagnosis not present

## 2023-02-27 DIAGNOSIS — I10 Essential (primary) hypertension: Secondary | ICD-10-CM | POA: Diagnosis not present

## 2023-03-29 DIAGNOSIS — E1159 Type 2 diabetes mellitus with other circulatory complications: Secondary | ICD-10-CM | POA: Diagnosis not present

## 2023-03-29 DIAGNOSIS — E663 Overweight: Secondary | ICD-10-CM | POA: Diagnosis not present

## 2023-03-29 DIAGNOSIS — E1165 Type 2 diabetes mellitus with hyperglycemia: Secondary | ICD-10-CM | POA: Diagnosis not present

## 2023-03-29 DIAGNOSIS — I10 Essential (primary) hypertension: Secondary | ICD-10-CM | POA: Diagnosis not present

## 2023-03-29 DIAGNOSIS — R1310 Dysphagia, unspecified: Secondary | ICD-10-CM | POA: Diagnosis not present

## 2023-03-29 DIAGNOSIS — Z6825 Body mass index (BMI) 25.0-25.9, adult: Secondary | ICD-10-CM | POA: Diagnosis not present

## 2023-03-29 DIAGNOSIS — E1140 Type 2 diabetes mellitus with diabetic neuropathy, unspecified: Secondary | ICD-10-CM | POA: Diagnosis not present

## 2023-03-29 DIAGNOSIS — G510 Bell's palsy: Secondary | ICD-10-CM | POA: Diagnosis not present

## 2023-04-07 DIAGNOSIS — E1165 Type 2 diabetes mellitus with hyperglycemia: Secondary | ICD-10-CM | POA: Diagnosis not present

## 2023-04-30 DIAGNOSIS — E1159 Type 2 diabetes mellitus with other circulatory complications: Secondary | ICD-10-CM | POA: Diagnosis not present

## 2023-04-30 DIAGNOSIS — I10 Essential (primary) hypertension: Secondary | ICD-10-CM | POA: Diagnosis not present

## 2023-04-30 DIAGNOSIS — Z794 Long term (current) use of insulin: Secondary | ICD-10-CM | POA: Diagnosis not present

## 2023-05-06 ENCOUNTER — Other Ambulatory Visit (HOSPITAL_COMMUNITY): Payer: Self-pay | Admitting: Internal Medicine

## 2023-05-06 DIAGNOSIS — R1310 Dysphagia, unspecified: Secondary | ICD-10-CM

## 2023-05-16 ENCOUNTER — Encounter (HOSPITAL_COMMUNITY): Payer: Self-pay

## 2023-05-16 ENCOUNTER — Ambulatory Visit (HOSPITAL_COMMUNITY): Payer: PPO

## 2023-05-30 DIAGNOSIS — I10 Essential (primary) hypertension: Secondary | ICD-10-CM | POA: Diagnosis not present

## 2023-05-30 DIAGNOSIS — E1159 Type 2 diabetes mellitus with other circulatory complications: Secondary | ICD-10-CM | POA: Diagnosis not present

## 2023-05-30 DIAGNOSIS — Z794 Long term (current) use of insulin: Secondary | ICD-10-CM | POA: Diagnosis not present

## 2023-06-03 DIAGNOSIS — Z6825 Body mass index (BMI) 25.0-25.9, adult: Secondary | ICD-10-CM | POA: Diagnosis not present

## 2023-06-03 DIAGNOSIS — E1140 Type 2 diabetes mellitus with diabetic neuropathy, unspecified: Secondary | ICD-10-CM | POA: Diagnosis not present

## 2023-06-03 DIAGNOSIS — B029 Zoster without complications: Secondary | ICD-10-CM | POA: Diagnosis not present

## 2023-06-03 DIAGNOSIS — E1159 Type 2 diabetes mellitus with other circulatory complications: Secondary | ICD-10-CM | POA: Diagnosis not present

## 2023-06-03 DIAGNOSIS — I10 Essential (primary) hypertension: Secondary | ICD-10-CM | POA: Diagnosis not present

## 2023-06-03 DIAGNOSIS — N400 Enlarged prostate without lower urinary tract symptoms: Secondary | ICD-10-CM | POA: Diagnosis not present

## 2023-06-03 DIAGNOSIS — E1165 Type 2 diabetes mellitus with hyperglycemia: Secondary | ICD-10-CM | POA: Diagnosis not present

## 2023-06-17 DIAGNOSIS — E663 Overweight: Secondary | ICD-10-CM | POA: Diagnosis not present

## 2023-06-17 DIAGNOSIS — Z6825 Body mass index (BMI) 25.0-25.9, adult: Secondary | ICD-10-CM | POA: Diagnosis not present

## 2023-06-17 DIAGNOSIS — E559 Vitamin D deficiency, unspecified: Secondary | ICD-10-CM | POA: Diagnosis not present

## 2023-06-17 DIAGNOSIS — I10 Essential (primary) hypertension: Secondary | ICD-10-CM | POA: Diagnosis not present

## 2023-06-17 DIAGNOSIS — Z794 Long term (current) use of insulin: Secondary | ICD-10-CM | POA: Diagnosis not present

## 2023-06-17 DIAGNOSIS — E1165 Type 2 diabetes mellitus with hyperglycemia: Secondary | ICD-10-CM | POA: Diagnosis not present

## 2023-06-17 DIAGNOSIS — E039 Hypothyroidism, unspecified: Secondary | ICD-10-CM | POA: Diagnosis not present

## 2023-06-17 DIAGNOSIS — E1140 Type 2 diabetes mellitus with diabetic neuropathy, unspecified: Secondary | ICD-10-CM | POA: Diagnosis not present

## 2023-06-17 DIAGNOSIS — D518 Other vitamin B12 deficiency anemias: Secondary | ICD-10-CM | POA: Diagnosis not present

## 2023-06-17 DIAGNOSIS — N400 Enlarged prostate without lower urinary tract symptoms: Secondary | ICD-10-CM | POA: Diagnosis not present

## 2023-06-17 DIAGNOSIS — Z0001 Encounter for general adult medical examination with abnormal findings: Secondary | ICD-10-CM | POA: Diagnosis not present

## 2023-06-17 DIAGNOSIS — Z1331 Encounter for screening for depression: Secondary | ICD-10-CM | POA: Diagnosis not present

## 2023-06-17 DIAGNOSIS — I358 Other nonrheumatic aortic valve disorders: Secondary | ICD-10-CM | POA: Diagnosis not present

## 2023-06-17 DIAGNOSIS — Z125 Encounter for screening for malignant neoplasm of prostate: Secondary | ICD-10-CM | POA: Diagnosis not present

## 2023-06-17 DIAGNOSIS — G9332 Myalgic encephalomyelitis/chronic fatigue syndrome: Secondary | ICD-10-CM | POA: Diagnosis not present

## 2023-07-30 DIAGNOSIS — E782 Mixed hyperlipidemia: Secondary | ICD-10-CM | POA: Diagnosis not present

## 2023-07-30 DIAGNOSIS — I10 Essential (primary) hypertension: Secondary | ICD-10-CM | POA: Diagnosis not present

## 2023-07-30 DIAGNOSIS — Z794 Long term (current) use of insulin: Secondary | ICD-10-CM | POA: Diagnosis not present

## 2023-07-30 DIAGNOSIS — E1159 Type 2 diabetes mellitus with other circulatory complications: Secondary | ICD-10-CM | POA: Diagnosis not present

## 2023-08-10 ENCOUNTER — Ambulatory Visit: Payer: PPO | Admitting: Dermatology

## 2023-08-10 ENCOUNTER — Encounter: Payer: Self-pay | Admitting: Dermatology

## 2023-08-10 DIAGNOSIS — D692 Other nonthrombocytopenic purpura: Secondary | ICD-10-CM

## 2023-08-10 DIAGNOSIS — L720 Epidermal cyst: Secondary | ICD-10-CM

## 2023-08-10 DIAGNOSIS — L820 Inflamed seborrheic keratosis: Secondary | ICD-10-CM | POA: Diagnosis not present

## 2023-08-10 DIAGNOSIS — L821 Other seborrheic keratosis: Secondary | ICD-10-CM

## 2023-08-10 DIAGNOSIS — L570 Actinic keratosis: Secondary | ICD-10-CM

## 2023-08-10 NOTE — Progress Notes (Signed)
   Follow-Up Visit   Subjective  Samuel Barr is a 72 y.o. male who presents for the following: spot at left ear. Patient noticed it last week, had a white looking scab on it, patient scratched it off and now ear is red and looks bruised.  The patient has spots, moles and lesions to be evaluated, some may be new or changing and the patient may have concern these could be cancer.   The following portions of the chart were reviewed this encounter and updated as appropriate: medications, allergies, medical history  Review of Systems:  No other skin or systemic complaints except as noted in HPI or Assessment and Plan.  Objective  Well appearing patient in no apparent distress; mood and affect are within normal limits.   A focused examination was performed of the following areas: Left ear, neck, arms, face  Relevant exam findings are noted in the Assessment and Plan.  R upper arm posterior x 1, R neck x 1 (2) Erythematous stuck-on, waxy papule or plaque    Assessment & Plan   Purpura - Chronic; persistent and recurrent.  Treatable, but not curable. - Violaceous macules on right antihelix inferior to crura with mild scaling. Possible remants of avulsed SK vs AK. Did cryotherapy as test spot. Do not bill - Benign  SEBORRHEIC KERATOSIS - Stuck-on, waxy, tan-brown papules and/or plaques on right arm and right neck - Benign-appearing - Discussed benign etiology and prognosis. - Observe - Call for any changes  Milia - tiny firm white papule on right lower eyelid - type of cyst - benign - sometimes these will clear with nightly OTC adapalene/Differin 0.1% gel or retinol. - may be extracted if symptomatic - observe  Inflamed seborrheic keratosis (2) R upper arm posterior x 1, R neck x 1  Symptomatic, irritating, patient would like treated.  Benign-appearing.  Call clinic for new or changing lesions.   Treated left antihelix as test spot.    Destruction of lesion - R  upper arm posterior x 1, R neck x 1 (2) Complexity: simple   Destruction method: cryotherapy   Informed consent: discussed and consent obtained   Timeout:  patient name, date of birth, surgical site, and procedure verified Lesion destroyed using liquid nitrogen: Yes   Region frozen until ice ball extended beyond lesion: Yes   Cryo cycles: 1 or 2. Outcome: patient tolerated procedure well with no complications   Post-procedure details: wound care instructions given      Return for with Dr. Kirtland Bouchard, as scheduled.  Anise Salvo, RMA, am acting as scribe for Elie Goody, MD .   Documentation: I have reviewed the above documentation for accuracy and completeness, and I agree with the above.  Elie Goody, MD

## 2023-08-10 NOTE — Patient Instructions (Addendum)

## 2023-09-13 ENCOUNTER — Encounter: Payer: Self-pay | Admitting: Cardiology

## 2023-09-13 ENCOUNTER — Ambulatory Visit: Payer: PPO | Attending: Cardiology | Admitting: Cardiology

## 2023-09-13 VITALS — BP 120/72 | HR 75 | Ht 70.0 in | Wt 189.0 lb

## 2023-09-13 DIAGNOSIS — G459 Transient cerebral ischemic attack, unspecified: Secondary | ICD-10-CM

## 2023-09-13 DIAGNOSIS — R011 Cardiac murmur, unspecified: Secondary | ICD-10-CM | POA: Diagnosis not present

## 2023-09-13 DIAGNOSIS — I358 Other nonrheumatic aortic valve disorders: Secondary | ICD-10-CM | POA: Diagnosis not present

## 2023-09-13 NOTE — Progress Notes (Signed)
 Clinical Summary Samuel Barr is a 73 y.o.male  1. 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 Followed at [facility]   2. Heart murmur 07/2019 echo LVEF 60-65%, no WMAs, mild aortic sclerosis without stenosis - no recent SOB/DOE, no cardiac chest pains.   Jan 2023 echo: LVEF 60-65%, no WMAs, indet diastolic, mild MR, moderate aortic sclerosis without significant stenosis  - no SOB/DOE, no recent edema, no chest pains.     3. 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 DM2 - followed by Dr Bertell Past Medical History:  Diagnosis Date   Cancer [facility]) 2008     No Known Allergies   Current Outpatient Medications  Medication Sig Dispense Refill   amLODipine  (NORVASC ) 5 MG tablet Take 1 tablet by mouth daily.     atorvastatin  (LIPITOR) 40 MG tablet Take 40 mg by mouth daily.     clopidogrel  (PLAVIX ) 75 MG tablet Take 1 tablet by mouth every morning.     LANTUS SOLOSTAR 100 UNIT/ML Solostar Pen Inject 60 Units into the skin at bedtime.     levothyroxine  (SYNTHROID , LEVOTHROID) 25 MCG tablet Take 25 mcg by mouth daily before breakfast.     metFORMIN (GLUCOPHAGE) 500 MG tablet Take 500 mg by mouth 2 (two) times daily with a meal.     No current facility-administered medications for this visit.     Past Surgical History:  Procedure Laterality Date   EYE SURGERY     HERNIA REPAIR     IR ANGIO INTRA EXTRACRAN SEL COM CAROTID INNOMINATE BILAT MOD SED  08/23/2019   IR ANGIO VERTEBRAL SEL VERTEBRAL BILAT MOD SED  08/23/2019   TONSILLECTOMY Left    cancer     No Known Allergies    Family History  Problem Relation Age of Onset   Diabetes Mother    Heart attack Father      Social  History Samuel Barr reports that he quit smoking about 44 years ago. He has never used smokeless tobacco. Samuel Barr reports that he does not currently use alcohol.   Review of Systems CONSTITUTIONAL: No weight loss, fever, chills, weakness or fatigue.  HEENT: Eyes: No visual loss, blurred vision, double vision or yellow sclerae.No hearing loss, sneezing, congestion, runny nose or sore throat.  SKIN: No rash or itching.  CARDIOVASCULAR: per hpi RESPIRATORY: No shortness of breath, cough or sputum.  GASTROINTESTINAL: No anorexia, nausea, vomiting or diarrhea. No abdominal pain or blood.  GENITOURINARY: No burning on urination, no polyuria NEUROLOGICAL: No headache, dizziness, syncope, paralysis, ataxia, numbness or tingling in the extremities. No change in bowel or bladder control.  MUSCULOSKELETAL: No muscle, back pain, joint pain or stiffness.  LYMPHATICS: No enlarged nodes. No history of splenectomy.  PSYCHIATRIC: No history of depression or anxiety.  ENDOCRINOLOGIC: No reports of sweating, cold or heat intolerance. No polyuria or polydipsia.  SABRA   Physical Examination Today's Vitals   09/13/23 0823  BP: 120/72  Pulse: 75  SpO2: 94%  Weight: 189 lb (85.7 kg)  Height: 5' 10 (1.778 m)  PainSc: 0-No pain   Body mass index is 27.12 kg/m.  Gen: resting comfortably, no  acute distress HEENT: no scleral icterus, pupils equal round and reactive, no palptable cervical adenopathy,  CV: RRR, 3/6 systolic murmur rusb, no jvd Resp: Clear to auscultation bilaterally GI: abdomen is soft, non-tender, non-distended, normal bowel sounds, no hepatosplenomegaly MSK: extremities are warm, no edema.  Skin: warm, no rash Neuro:  no focal deficits Psych: appropriate affect   Diagnostic Studies 2003 cath RESULTS:   HEMODYNAMICS: Left ventricular pressure 125/15. Aortic pressure 112/66.  There was no aortic valve gradient.   LEFT VENTRICULOGRAM: Wall motion is normal. Ejection fraction  calculated at 63%. There is no mitral regurgitation.   CORONARY ARTERIOGRAPHY: (Right dominant).   Left main has an ostial 20% stenosis.   Left anterior descending artery is a relatively small vessel. It gives rise to a small first diagonal and a normal sized second diagonal Masin Shatto. The LAD is normal.   Left circumflex is a small vessel. It gives rise to a small OM-1 and a small OM-2. The left circumflex is normal.   Right coronary artery is a very large super dominant vessel. There is a 20% stenosis in the ostium of the right coronary artery. The right coronary artery is otherwise angiographically normal. The distal right coronary artery gives rise to a normal sized posterior descending artery, small first and second posterolateral branches, large third posterolateral Ariele Vidrio, small forth posterolateral Juhi Lagrange, and a normal sized fifth posterolateral Trajon Rosete.   IMPRESSIONS: 1. Normal left ventricular systolic function. 2. No significant coronary artery disease identified.      Jan 2023 echo  1. Left ventricular ejection fraction, by estimation, is 60 to 65%. The  left ventricle has normal function. The left ventricle has no regional  wall motion abnormalities. There is moderate left ventricular hypertrophy.  Left ventricular diastolic  parameters are indeterminate. The average left ventricular global  longitudinal strain is -18.0 %. The global longitudinal strain is normal.   2. Right ventricular systolic function is normal. The right ventricular  size is normal.   3. The mitral valve is abnormal. Mild mitral valve regurgitation. No  evidence of mitral stenosis.   4. The aortic valve has an indeterminant number of cusps. There is  moderate calcification of the aortic valve. There is moderate thickening  of the aortic valve. Aortic valve regurgitation is not visualized. No  aortic stenosis is present.   5. The inferior vena cava is normal in size with greater than 50%   respiratory variability, suggesting right atrial pressure of 3 mmHg.    Assessment and Plan  1. Aortic sclerosis - some progression of murmur, will update echo to see if has progrssed to aortic stenosis  2. HLD -at goal, continue current meds  EKG shows SR, no ischemic chagnes.    F/u 2 years   Dorn PHEBE Ross, M.D.

## 2023-09-13 NOTE — Patient Instructions (Signed)
 Medication Instructions:  Your physician recommends that you continue on your current medications as directed. Please refer to the Current Medication list given to you today.  *If you need a refill on your cardiac medications before your next appointment, please call your pharmacy*   Lab Work: None If you have labs (blood work) drawn today and your tests are completely normal, you will receive your results only by: MyChart Message (if you have MyChart) OR A paper copy in the mail If you have any lab test that is abnormal or we need to change your treatment, we will call you to review the results.   Testing/Procedures: Your physician has requested that you have an echocardiogram. Echocardiography is a painless test that uses sound waves to create images of your heart. It provides your doctor with information about the size and shape of your heart and how well your heart's chambers and valves are working. This procedure takes approximately one hour. There are no restrictions for this procedure. Please do NOT wear cologne, perfume, aftershave, or lotions (deodorant is allowed). Please arrive 15 minutes prior to your appointment time.  Please note: We ask at that you not bring children with you during ultrasound (echo/ vascular) testing. Due to room size and safety concerns, children are not allowed in the ultrasound rooms during exams. Our front office staff cannot provide observation of children in our lobby area while testing is being conducted. An adult accompanying a patient to their appointment will only be allowed in the ultrasound room at the discretion of the ultrasound technician under special circumstances. We apologize for any inconvenience.    Follow-Up: At [facility], you and your health needs are our priority.  As part of our continuing mission to provide you with exceptional heart care, we have created designated Provider Care Teams.  These Care Teams include your  primary Cardiologist (physician) and Advanced Practice Providers (APPs -  Physician Assistants and Nurse Practitioners) who all work together to provide you with the care you need, when you need it.  We recommend signing up for the patient portal called MyChart.  Sign up information is provided on this After Visit Summary.  MyChart is used to connect with patients for Virtual Visits (Telemedicine).  Patients are able to view lab/test results, encounter notes, upcoming appointments, etc.  Non-urgent messages can be sent to your provider as well.   To learn more about what you can do with MyChart, go to [email or web address].    Your next appointment:   2 year(s)  Provider:   You may see Alvan Carrier, MD or one of the following Advanced Practice Providers on your designated Care Team:   Laymon Qua, PA-C  Olivia Pavy, [place]     Other Instructions

## 2023-09-21 ENCOUNTER — Ambulatory Visit (HOSPITAL_COMMUNITY)
Admission: RE | Admit: 2023-09-21 | Discharge: 2023-09-21 | Disposition: A | Discharge status: Home / Self Care | Payer: PPO | Source: Ambulatory Visit | Attending: Cardiology | Admitting: Cardiology

## 2023-09-21 DIAGNOSIS — R011 Cardiac murmur, unspecified: Secondary | ICD-10-CM | POA: Insufficient documentation

## 2023-09-21 LAB — ECHOCARDIOGRAM COMPLETE
AR max vel: 2.21 cm2
AV Area VTI: 1.96 cm2
AV Area mean vel: 1.93 cm2
AV Mean grad: 9 mm[Hg]
AV Peak grad: 17.2 mm[Hg]
Ao pk vel: 2.07 m/s
Area-P 1/2: 3.99 cm2
S' Lateral: 2.6 cm

## 2023-09-21 NOTE — Progress Notes (Signed)
*  PRELIMINARY RESULTS* Echocardiogram 2D Echocardiogram has been performed.  Stacey Drain 09/21/2023, 10:27 AM

## 2023-09-26 DIAGNOSIS — J209 Acute bronchitis, unspecified: Secondary | ICD-10-CM | POA: Diagnosis not present

## 2023-10-01 DIAGNOSIS — Z6825 Body mass index (BMI) 25.0-25.9, adult: Secondary | ICD-10-CM | POA: Diagnosis not present

## 2023-10-01 DIAGNOSIS — J019 Acute sinusitis, unspecified: Secondary | ICD-10-CM | POA: Diagnosis not present

## 2023-10-01 DIAGNOSIS — R030 Elevated blood-pressure reading, without diagnosis of hypertension: Secondary | ICD-10-CM | POA: Diagnosis not present

## 2023-10-01 DIAGNOSIS — E663 Overweight: Secondary | ICD-10-CM | POA: Diagnosis not present

## 2024-02-01 ENCOUNTER — Ambulatory Visit: Payer: PPO | Admitting: Dermatology

## 2024-02-02 ENCOUNTER — Ambulatory Visit: Admitting: Dermatology

## 2024-02-02 ENCOUNTER — Encounter: Payer: Self-pay | Admitting: Dermatology

## 2024-02-02 ENCOUNTER — Telehealth: Payer: Self-pay

## 2024-02-02 DIAGNOSIS — D492 Neoplasm of unspecified behavior of bone, soft tissue, and skin: Secondary | ICD-10-CM | POA: Diagnosis not present

## 2024-02-02 DIAGNOSIS — D2261 Melanocytic nevi of right upper limb, including shoulder: Secondary | ICD-10-CM

## 2024-02-02 DIAGNOSIS — L570 Actinic keratosis: Secondary | ICD-10-CM

## 2024-02-02 DIAGNOSIS — Z7189 Other specified counseling: Secondary | ICD-10-CM

## 2024-02-02 DIAGNOSIS — W908XXA Exposure to other nonionizing radiation, initial encounter: Secondary | ICD-10-CM

## 2024-02-02 DIAGNOSIS — D1801 Hemangioma of skin and subcutaneous tissue: Secondary | ICD-10-CM

## 2024-02-02 DIAGNOSIS — L814 Other melanin hyperpigmentation: Secondary | ICD-10-CM | POA: Diagnosis not present

## 2024-02-02 DIAGNOSIS — D229 Melanocytic nevi, unspecified: Secondary | ICD-10-CM

## 2024-02-02 DIAGNOSIS — L729 Follicular cyst of the skin and subcutaneous tissue, unspecified: Secondary | ICD-10-CM

## 2024-02-02 DIAGNOSIS — L711 Rhinophyma: Secondary | ICD-10-CM | POA: Diagnosis not present

## 2024-02-02 DIAGNOSIS — Z79899 Other long term (current) drug therapy: Secondary | ICD-10-CM

## 2024-02-02 DIAGNOSIS — L719 Rosacea, unspecified: Secondary | ICD-10-CM

## 2024-02-02 DIAGNOSIS — L821 Other seborrheic keratosis: Secondary | ICD-10-CM

## 2024-02-02 DIAGNOSIS — L820 Inflamed seborrheic keratosis: Secondary | ICD-10-CM

## 2024-02-02 DIAGNOSIS — S20369A Insect bite (nonvenomous) of unspecified front wall of thorax, initial encounter: Secondary | ICD-10-CM

## 2024-02-02 DIAGNOSIS — Z1283 Encounter for screening for malignant neoplasm of skin: Secondary | ICD-10-CM | POA: Diagnosis not present

## 2024-02-02 DIAGNOSIS — L578 Other skin changes due to chronic exposure to nonionizing radiation: Secondary | ICD-10-CM | POA: Diagnosis not present

## 2024-02-02 MED ORDER — DOXYCYCLINE HYCLATE 100 MG PO TABS: ORAL_TABLET | ORAL | 0 refills | Status: AC

## 2024-02-02 NOTE — Progress Notes (Signed)
 Follow-Up Visit   Subjective  Samuel Barr is a 73 y.o. male who presents for the following: Skin Cancer Screening and Full Body Skin Exam, hx of precancers. Patient report hx of tick bites he removed several off his skin a few days ago, no fevers chills or sweat.   The patient presents for Total-Body Skin Exam (TBSE) for skin cancer screening and mole check. The patient has spots, moles and lesions to be evaluated, some may be new or changing and the patient may have concern these could be cancer.  The following portions of the chart were reviewed this encounter and updated as appropriate: medications, allergies, medical history  Review of Systems:  No other skin or systemic complaints except as noted in HPI or Assessment and Plan.  Objective  Well appearing patient in no apparent distress; mood and affect are within normal limits.  A full examination was performed including scalp, head, eyes, ears, nose, lips, neck, chest, axillae, abdomen, back, buttocks, bilateral upper extremities, bilateral lower extremities, hands, feet, fingers, toes, fingernails, and toenails. All findings within normal limits unless otherwise noted below.   Relevant physical exam findings are noted in the Assessment and Plan.  right superior helix, left ear x 4 (4) Erythematous thin papules/macules with gritty scale.  arms, hands  x 5,  left side, right side  x 2 (7) Stuck-on, waxy, tan-brown papules and plaques -- Discussed benign etiology and prognosis.  right lateral antecubital 0.6 cm irregular brown macule          Assessment & Plan   SKIN CANCER SCREENING PERFORMED TODAY.  LENTIGINES, SEBORRHEIC KERATOSES, HEMANGIOMAS - Benign normal skin lesions - Benign-appearing - Call for any changes  MELANOCYTIC NEVI - Tan-brown and/or pink-flesh-colored symmetric macules and papules - Benign appearing on exam today - Observation - Call clinic for new or changing moles - Recommend daily use of  broad spectrum spf 30+ sunscreen to sun-exposed areas.   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 Condition is symptomatic/ bothersome to patient. Not currently at goal.  Rosacea is a chronic progressive skin condition usually affecting the face of adults, causing redness and/or acne bumps. It is treatable but not curable. It sometimes affects the eyes (ocular rosacea) as well. It may respond to topical and/or systemic medication and can flare with stress, sun exposure, alcohol, exercise, topical steroids (including hydrocortisone/cortisone 10) and some foods.  Daily application of broad spectrum spf 30+ sunscreen to face is recommended to reduce flares. Treatment Plan Counseling for BBL / IPL / Laser and Coordination of Care Discussed the treatment option of Broad Band Light (BBL) /Intense Pulsed Light (IPL)/ Laser for skin discoloration, including brown spots and redness.  Typically we recommend at least 1-3 treatment sessions about 5-8 weeks apart for best results.  Cannot have tanned skin when BBL performed, and regular use of sunscreen/photoprotection is advised after the procedure to help maintain results. The patient's condition may also require "maintenance treatments" in the future.  The fee for BBL / laser treatments is $350 per treatment session for the whole face.  A fee can be quoted for other parts of the body.  Insurance typically does not pay for BBL/laser treatments and therefore the fee is an out-of-pocket cost. Recommend prophylactic valtrex treatment. Once scheduled for procedure, will send Rx in prior to patient's appointment.    Treatment: Metronidazole gel at bedtime to nose.  Inflamed EPIDERMAL INCLUSION CYST Exam: 1.2 cm Subcutaneous nodule at right axilla  See photo Benign-appearing. Exam most consistent with an epidermal inclusion cyst. Discussed  that a cyst is a benign growth that can grow over time and sometimes get irritated or inflamed. Recommend observation if it is not bothersome. Discussed option of surgical excision to remove it if it is growing, symptomatic, or other changes noted. Please call for new or changing lesions so they can be evaluated.   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  - Recommend staying in the shade or wearing long sleeves, sun glasses (UVA+UVB protection) and wide brim hats (4-inch brim around the entire circumference of the hat). - Call for new or changing lesions.  Destruction of lesion - right superior helix, left ear x 4 (4) Complexity: simple   Destruction method: cryotherapy   Informed consent: discussed and consent obtained   Timeout:  patient name, date of birth, surgical site, and procedure verified Lesion destroyed using liquid nitrogen: Yes   Region frozen until ice ball extended beyond lesion: Yes   Outcome: patient tolerated procedure well with no complications   Post-procedure details: wound care instructions given   INFLAMED SEBORRHEIC KERATOSIS (7) arms, hands  x 5,  left side, right side  x 2 (7) Symptomatic, irritating, patient would like treated.  Destruction of lesion - arms, hands  x 5,  left side, right side  x 2 (7) Complexity: simple   Destruction method: cryotherapy   Informed consent: discussed and consent obtained   Timeout:  patient name, date of birth,  surgical site, and procedure verified Lesion destroyed using liquid nitrogen: Yes   Region frozen until ice ball extended beyond lesion: Yes   Outcome: patient tolerated procedure well with no complications   Post-procedure details: wound care instructions given   NEOPLASM OF SKIN right lateral antecubital Epidermal / dermal shaving  Lesion diameter (cm):  0.6 Informed consent: discussed and consent obtained   Timeout: patient name, date of birth, surgical site, and procedure verified   Procedure prep:  Patient was prepped and draped in usual sterile fashion Prep type:  Isopropyl alcohol Anesthesia: the lesion was anesthetized in a standard fashion   Anesthetic:  1% lidocaine  w/ epinephrine 1-100,000 buffered w/ 8.4% NaHCO3 Hemostasis achieved with: pressure, aluminum chloride and electrodesiccation   Outcome: patient tolerated procedure well   Post-procedure details: sterile dressing applied and wound care instructions given   Dressing type: bandage and petrolatum   Specimen 1 - Surgical pathology Differential Diagnosis: R/O Dysplastic nevus   Check Margins: No SKIN CANCER SCREENING   ACTINIC SKIN DAMAGE   LENTIGO   MELANOCYTIC NEVUS, UNSPECIFIED LOCATION   ROSACEA   COUNSELING AND COORDINATION OF CARE   MEDICATION MANAGEMENT   CYST OF SKIN   INSECT BITE OF FRONT WALL OF THORAX, UNSPECIFIED LOCATION, INITIAL ENCOUNTER    Return in about 1 year (around 02/01/2025) for TBSE, hx of precancers .  Jacob Master, CMA, am  acting as scribe for Celine Collard, MD .   Documentation: I have reviewed the above documentation for accuracy and completeness, and I agree with the above.  Celine Collard, MD

## 2024-02-02 NOTE — Telephone Encounter (Signed)
-----   Message from Celine Collard sent at 02/02/2024  2:46 PM EDT ----- Did this pt have tinea pedis (did we send Ketoconazole cream?)  Also, send in Metrogel at bedtime to nose for pt with 1 year refill If pt does not want it, send anyway and he does not have to pick up if he does not want it. It will be there if he does.  Please advise pt and document.

## 2024-02-02 NOTE — Patient Instructions (Addendum)

## 2024-02-02 NOTE — Telephone Encounter (Signed)
 Left voicemail for patient to return my call.

## 2024-02-06 ENCOUNTER — Ambulatory Visit: Payer: Self-pay | Admitting: Dermatology

## 2024-02-06 ENCOUNTER — Other Ambulatory Visit: Payer: Self-pay

## 2024-02-06 LAB — SURGICAL PATHOLOGY

## 2024-02-06 MED ORDER — METRONIDAZOLE 1 % EX GEL: CUTANEOUS | 6 refills | Status: AC

## 2024-02-06 MED ORDER — KETOCONAZOLE 2 % EX CREA: TOPICAL_CREAM | CUTANEOUS | 6 refills | Status: AC

## 2024-02-06 NOTE — Telephone Encounter (Signed)
 Called pt, he would like to start Ketoconazole cream for tinea pedis and Metro gel for Rosacea on his nose.  Ketoconazole and Metro gel called in to [facility] in [place]

## 2024-02-07 NOTE — Telephone Encounter (Addendum)
 Tried calling patient. No answer. LM for patient to return call. ----- Message from Celine Collard sent at 02/06/2024  8:19 PM EDT ----- FINAL DIAGNOSIS        1. Skin, right lateral antecubital :       MELANOCYTIC NEVUS, INTRADERMAL TYPE, IRRITATED, PRESENT IN BASE   Benign irritated mole No further treatment needed.

## 2024-02-08 NOTE — Telephone Encounter (Addendum)
 Called and discussed bx results with patient. He verbalized understanding and denied further questions.  ----- Message from Celine Collard sent at 02/06/2024  8:19 PM EDT ----- FINAL DIAGNOSIS        1. Skin, right lateral antecubital :       MELANOCYTIC NEVUS, INTRADERMAL TYPE, IRRITATED, PRESENT IN BASE   Benign irritated mole No further treatment needed.

## 2024-02-20 DIAGNOSIS — E1159 Type 2 diabetes mellitus with other circulatory complications: Secondary | ICD-10-CM | POA: Diagnosis not present

## 2024-02-20 DIAGNOSIS — Z6825 Body mass index (BMI) 25.0-25.9, adult: Secondary | ICD-10-CM | POA: Diagnosis not present

## 2024-02-20 DIAGNOSIS — E663 Overweight: Secondary | ICD-10-CM | POA: Diagnosis not present

## 2024-02-20 DIAGNOSIS — E1165 Type 2 diabetes mellitus with hyperglycemia: Secondary | ICD-10-CM | POA: Diagnosis not present

## 2024-02-20 DIAGNOSIS — N400 Enlarged prostate without lower urinary tract symptoms: Secondary | ICD-10-CM | POA: Diagnosis not present

## 2024-02-20 DIAGNOSIS — I358 Other nonrheumatic aortic valve disorders: Secondary | ICD-10-CM | POA: Diagnosis not present

## 2024-02-20 DIAGNOSIS — I10 Essential (primary) hypertension: Secondary | ICD-10-CM | POA: Diagnosis not present

## 2024-02-20 DIAGNOSIS — E1140 Type 2 diabetes mellitus with diabetic neuropathy, unspecified: Secondary | ICD-10-CM | POA: Diagnosis not present

## 2024-02-20 DIAGNOSIS — E039 Hypothyroidism, unspecified: Secondary | ICD-10-CM | POA: Diagnosis not present

## 2024-02-20 DIAGNOSIS — Z794 Long term (current) use of insulin: Secondary | ICD-10-CM | POA: Diagnosis not present

## 2024-02-22 ENCOUNTER — Encounter: Admitting: Dermatology

## 2024-02-25 ENCOUNTER — Encounter (HOSPITAL_COMMUNITY): Payer: Self-pay | Admitting: Interventional Radiology

## 2024-03-29 DIAGNOSIS — E782 Mixed hyperlipidemia: Secondary | ICD-10-CM | POA: Diagnosis not present

## 2024-03-29 DIAGNOSIS — E1159 Type 2 diabetes mellitus with other circulatory complications: Secondary | ICD-10-CM | POA: Diagnosis not present

## 2024-03-29 DIAGNOSIS — I10 Essential (primary) hypertension: Secondary | ICD-10-CM | POA: Diagnosis not present

## 2024-04-10 DIAGNOSIS — J9801 Acute bronchospasm: Secondary | ICD-10-CM | POA: Diagnosis not present

## 2024-04-26 DIAGNOSIS — Z794 Long term (current) use of insulin: Secondary | ICD-10-CM | POA: Diagnosis not present

## 2024-04-26 DIAGNOSIS — E782 Mixed hyperlipidemia: Secondary | ICD-10-CM | POA: Diagnosis not present

## 2024-04-26 DIAGNOSIS — Z6826 Body mass index (BMI) 26.0-26.9, adult: Secondary | ICD-10-CM | POA: Diagnosis not present

## 2024-04-26 DIAGNOSIS — E1159 Type 2 diabetes mellitus with other circulatory complications: Secondary | ICD-10-CM | POA: Diagnosis not present

## 2024-04-26 DIAGNOSIS — I10 Essential (primary) hypertension: Secondary | ICD-10-CM | POA: Diagnosis not present

## 2024-04-26 DIAGNOSIS — E1165 Type 2 diabetes mellitus with hyperglycemia: Secondary | ICD-10-CM | POA: Diagnosis not present

## 2024-04-26 DIAGNOSIS — E1140 Type 2 diabetes mellitus with diabetic neuropathy, unspecified: Secondary | ICD-10-CM | POA: Diagnosis not present

## 2024-04-26 DIAGNOSIS — I358 Other nonrheumatic aortic valve disorders: Secondary | ICD-10-CM | POA: Diagnosis not present

## 2024-04-26 DIAGNOSIS — E039 Hypothyroidism, unspecified: Secondary | ICD-10-CM | POA: Diagnosis not present

## 2024-05-03 DIAGNOSIS — Z79899 Other long term (current) drug therapy: Secondary | ICD-10-CM | POA: Diagnosis not present

## 2024-05-03 DIAGNOSIS — E1169 Type 2 diabetes mellitus with other specified complication: Secondary | ICD-10-CM | POA: Diagnosis not present

## 2024-05-03 DIAGNOSIS — Z6826 Body mass index (BMI) 26.0-26.9, adult: Secondary | ICD-10-CM | POA: Diagnosis not present

## 2024-05-03 DIAGNOSIS — E782 Mixed hyperlipidemia: Secondary | ICD-10-CM | POA: Diagnosis not present

## 2024-05-03 DIAGNOSIS — I10 Essential (primary) hypertension: Secondary | ICD-10-CM | POA: Diagnosis not present

## 2024-05-03 DIAGNOSIS — E039 Hypothyroidism, unspecified: Secondary | ICD-10-CM | POA: Diagnosis not present

## 2024-05-03 DIAGNOSIS — Z794 Long term (current) use of insulin: Secondary | ICD-10-CM | POA: Diagnosis not present

## 2024-05-03 DIAGNOSIS — Z85819 Personal history of malignant neoplasm of unspecified site of lip, oral cavity, and pharynx: Secondary | ICD-10-CM | POA: Diagnosis not present

## 2024-05-03 DIAGNOSIS — Z79891 Long term (current) use of opiate analgesic: Secondary | ICD-10-CM | POA: Diagnosis not present

## 2024-05-03 DIAGNOSIS — Z7689 Persons encountering health services in other specified circumstances: Secondary | ICD-10-CM | POA: Diagnosis not present

## 2024-05-03 DIAGNOSIS — Z79890 Hormone replacement therapy (postmenopausal): Secondary | ICD-10-CM | POA: Diagnosis not present

## 2024-06-04 DIAGNOSIS — I10 Essential (primary) hypertension: Secondary | ICD-10-CM | POA: Diagnosis not present

## 2024-06-04 DIAGNOSIS — E1169 Type 2 diabetes mellitus with other specified complication: Secondary | ICD-10-CM | POA: Diagnosis not present

## 2024-06-04 DIAGNOSIS — E039 Hypothyroidism, unspecified: Secondary | ICD-10-CM | POA: Diagnosis not present

## 2024-06-04 DIAGNOSIS — E782 Mixed hyperlipidemia: Secondary | ICD-10-CM | POA: Diagnosis not present

## 2024-06-04 DIAGNOSIS — Z125 Encounter for screening for malignant neoplasm of prostate: Secondary | ICD-10-CM | POA: Diagnosis not present

## 2024-06-04 DIAGNOSIS — Z794 Long term (current) use of insulin: Secondary | ICD-10-CM | POA: Diagnosis not present

## 2024-06-15 DIAGNOSIS — G5792 Unspecified mononeuropathy of left lower limb: Secondary | ICD-10-CM | POA: Diagnosis not present

## 2024-06-15 DIAGNOSIS — Z794 Long term (current) use of insulin: Secondary | ICD-10-CM | POA: Diagnosis not present

## 2024-06-15 DIAGNOSIS — E039 Hypothyroidism, unspecified: Secondary | ICD-10-CM | POA: Diagnosis not present

## 2024-06-15 DIAGNOSIS — E782 Mixed hyperlipidemia: Secondary | ICD-10-CM | POA: Diagnosis not present

## 2024-06-15 DIAGNOSIS — E1169 Type 2 diabetes mellitus with other specified complication: Secondary | ICD-10-CM | POA: Diagnosis not present

## 2024-06-15 DIAGNOSIS — I10 Essential (primary) hypertension: Secondary | ICD-10-CM | POA: Diagnosis not present

## 2024-06-26 ENCOUNTER — Ambulatory Visit: Payer: Self-pay | Admitting: Podiatry

## 2024-06-29 ENCOUNTER — Encounter: Payer: Self-pay | Admitting: Podiatry

## 2024-06-29 ENCOUNTER — Ambulatory Visit (INDEPENDENT_AMBULATORY_CARE_PROVIDER_SITE_OTHER): Payer: Self-pay | Admitting: Podiatry

## 2024-06-29 VITALS — Ht 70.0 in | Wt 189.0 lb

## 2024-06-29 DIAGNOSIS — E119 Type 2 diabetes mellitus without complications: Secondary | ICD-10-CM | POA: Diagnosis not present

## 2024-06-29 DIAGNOSIS — Z0189 Encounter for other specified special examinations: Secondary | ICD-10-CM | POA: Diagnosis not present

## 2024-07-10 NOTE — Progress Notes (Signed)
   Chief Complaint  Patient presents with   Toe Pain    -diabetic foot evaluation. He mention having tingling and numbness in the tips of his toes. Left >right. Has tried OTC meds that only work temporarily.    HPI: 73 y.o. male presenting today as a new patient for diabetic foot exam.  History of peripheral polyneuropathy and he has tried different oral medication with no improvement.  Past Medical History:  Diagnosis Date   Cancer (HCC) 2008    Past Surgical History:  Procedure Laterality Date   EYE SURGERY     HERNIA REPAIR     IR ANGIO INTRA EXTRACRAN SEL COM CAROTID INNOMINATE BILAT MOD SED  08/23/2019   IR ANGIO VERTEBRAL SEL VERTEBRAL BILAT MOD SED  08/23/2019   TONSILLECTOMY Left    cancer    No Known Allergies   Physical Exam: General: The patient is alert and oriented x3 in no acute distress.  Dermatology: Skin is warm, dry and supple bilateral lower extremities.  Hyperkeratotic dystrophic nails noted 1-5 bilateral  Vascular: Palpable pedal pulses bilaterally. Capillary refill within normal limits.  No appreciable edema.  No erythema.  Neurological: Grossly intact via light touch  Musculoskeletal Exam: No pedal deformities noted   Assessment/Plan of Care: 1.  Encounter for diabetic foot exam 2.  Peripheral neuropathy bilateral  -Patient evaluated -Comprehensive diabetic foot exam performed today -Recommend good supportive tennis shoes and sneakers.  Refrain from going barefoot -Return to clinic PRN      Thresa EMERSON Sar, DPM [facility]  Dr. Thresa EMERSON Sar, DPM    [address] [place], [place] [address]                Office [phone]  Fax [phone]

## 2024-08-17 ENCOUNTER — Ambulatory Visit: Admitting: Podiatry

## 2024-08-22 ENCOUNTER — Encounter: Payer: Self-pay | Admitting: Podiatry

## 2024-08-22 ENCOUNTER — Ambulatory Visit: Admitting: Podiatry

## 2024-08-22 ENCOUNTER — Ambulatory Visit (INDEPENDENT_AMBULATORY_CARE_PROVIDER_SITE_OTHER)

## 2024-08-22 VITALS — Ht 70.0 in | Wt 189.0 lb

## 2024-08-22 DIAGNOSIS — M7752 Other enthesopathy of left foot: Secondary | ICD-10-CM

## 2024-08-22 DIAGNOSIS — M65972 Unspecified synovitis and tenosynovitis, left ankle and foot: Secondary | ICD-10-CM

## 2024-08-22 DIAGNOSIS — S90211A Contusion of right great toe with damage to nail, initial encounter: Secondary | ICD-10-CM

## 2024-09-03 DIAGNOSIS — M65972 Unspecified synovitis and tenosynovitis, left ankle and foot: Secondary | ICD-10-CM | POA: Diagnosis not present

## 2024-09-03 MED ORDER — BETAMETHASONE SOD PHOS & ACET 6 (3-3) MG/ML IJ SUSP
3.0000 mg | Freq: Once | INTRAMUSCULAR | Status: AC
  Administered 2024-09-03: 3 mg via INTRA_ARTICULAR

## 2024-09-03 NOTE — Progress Notes (Signed)
" ° °  Chief Complaint  Patient presents with   Nail Problem    Pt is here due to right great toenail, states he ran over the toe with a golf cart states no pain at all concern about the nail, thinks it may need to come off, nail looks like it has a blood blister in it.    HPI: 74 y.o. male presenting for evaluation of an injury associated to the right great toe.  Also he has been experiencing some left ankle pain  Past Medical History:  Diagnosis Date   Cancer (HCC) 2008    Past Surgical History:  Procedure Laterality Date   EYE SURGERY     HERNIA REPAIR     IR ANGIO INTRA EXTRACRAN SEL COM CAROTID INNOMINATE BILAT MOD SED  08/23/2019   IR ANGIO VERTEBRAL SEL VERTEBRAL BILAT MOD SED  08/23/2019   TONSILLECTOMY Left    cancer    Allergies[1]   RT great toenail 08/22/2024   Physical Exam: General: The patient is alert and oriented x3 in no acute distress.  Dermatology: Skin is warm, dry and supple bilateral lower extremities.  Dried subungual hemorrhagic nail plate noted to the right hallux.  It appears very dry and stable.  No drainage.  No surrounding erythema  Vascular: Palpable pedal pulses bilaterally. Capillary refill within normal limits.  No appreciable edema.  No erythema.  Neurological: Grossly intact via light touch  Musculoskeletal Exam: No pedal deformities noted.  There is some mild associated tenderness to palpation around the right ankle joint  Radiographic Exam LT ankle 08/22/2024:  Normal osseous mineralization. Joint spaces preserved.  No fractures or irregularities noted.  Impression: Normal exam  Assessment/Plan of Care: 1.  Synovitis left ankle 2.  Subungual hematoma; stable right hallux nail plate  -Patient evaluated.  X-rays reviewed -The right hallux nail plate should grow out uneventfully.  I did explain that he may potentially lose the nail plate and the nail plate may fall off as a new nail grows in.  For now we will simply observe -In regards  to the left ankle, recommend rest, ice, compression, and elevation. -Injection of 0.5 cc Celestone  Soluspan injected into the left ankle -Recommend good supportive tennis shoes and sneakers -Return to clinic next scheduled appointment for routine footcare     Thresa EMERSON Sar, DPM [facility]  Dr. Thresa EMERSON Sar, DPM    [address] [place], [place] [address]                Office [phone]  Fax [phone]        [1] No Known Allergies  "

## 2024-12-10 ENCOUNTER — Encounter: Admitting: Dermatology

## 2025-02-07 ENCOUNTER — Ambulatory Visit: Admitting: Dermatology
